# Patient Record
Sex: Male | Born: 1945 | Race: White | Hispanic: No | Marital: Married | Smoking: Current every day smoker
Health system: Southern US, Community
[De-identification: ages and names within clinical notes are randomized; demographics above are authoritative.]

## PROBLEM LIST (undated history)

## (undated) DIAGNOSIS — K219 Gastro-esophageal reflux disease without esophagitis: Secondary | ICD-10-CM

## (undated) DIAGNOSIS — F329 Major depressive disorder, single episode, unspecified: Secondary | ICD-10-CM

## (undated) DIAGNOSIS — F32A Depression, unspecified: Secondary | ICD-10-CM

## (undated) DIAGNOSIS — I739 Peripheral vascular disease, unspecified: Secondary | ICD-10-CM

## (undated) DIAGNOSIS — I10 Essential (primary) hypertension: Secondary | ICD-10-CM

## (undated) DIAGNOSIS — E119 Type 2 diabetes mellitus without complications: Secondary | ICD-10-CM

## (undated) DIAGNOSIS — E7800 Pure hypercholesterolemia, unspecified: Secondary | ICD-10-CM

## (undated) HISTORY — PX: OTHER SURGICAL HISTORY: SHX169

## (undated) HISTORY — DX: Type 2 diabetes mellitus without complications: E11.9

## (undated) HISTORY — PX: BACK SURGERY: SHX140

## (undated) HISTORY — DX: Peripheral vascular disease, unspecified: I73.9

## (undated) HISTORY — DX: Gastro-esophageal reflux disease without esophagitis: K21.9

## (undated) HISTORY — DX: Pure hypercholesterolemia, unspecified: E78.00

## (undated) HISTORY — PX: CHOLECYSTECTOMY, LAPAROSCOPIC: SHX56

## (undated) HISTORY — DX: Depression, unspecified: F32.A

## (undated) HISTORY — DX: Essential (primary) hypertension: I10

---
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
## 2019-11-01 ENCOUNTER — Encounter: Payer: Self-pay | Admitting: Internal Medicine

## 2019-11-19 ENCOUNTER — Other Ambulatory Visit: Payer: Self-pay | Admitting: *Deleted

## 2019-11-19 ENCOUNTER — Other Ambulatory Visit: Payer: Self-pay

## 2019-11-19 ENCOUNTER — Ambulatory Visit (INDEPENDENT_AMBULATORY_CARE_PROVIDER_SITE_OTHER): Payer: Medicare Other | Admitting: Nurse Practitioner

## 2019-11-19 ENCOUNTER — Encounter: Payer: Self-pay | Admitting: Nurse Practitioner

## 2019-11-19 DIAGNOSIS — D509 Iron deficiency anemia, unspecified: Secondary | ICD-10-CM

## 2019-11-19 NOTE — Assessment & Plan Note (Signed)
The patient was referred to [place] by primary care for history of iron deficiency anemia.  The patient states he was previously seen by Dr. Samuella Cota in [place], [place].  He states within the past 3 to 4 months he has had a colonoscopy, upper endoscopy, and capsule study.  He states they did not find anything.  No explanation for his anemia.  Reviewing labs his anemia has been pretty significant over the past several months including a hemoglobin of 6.3 on 04/10/2019.  This improved with transfusion to 9.7 on 05/27/2019 and most recently on 08/26/2019 it drifted a little to 9.0.  His indices are microcytic and hyperchromic.  His iron is low at 19, transferrin saturation low at 4, total iron binding capacity high at 442, ferritin low at 3.9.  Overall indication of chronic slow bleed, cannot rule out GI source.  Given has not had labs in about 3 months I will recheck a CBC, iron, ferritin.  I would like to stay ahead of his hemoglobin to prevent a transfusion dependent anemia.  I will also check an iFOBT for blood.  He states he has not seen any blood.  I will have him restart his daily iron 325 mg of iron sulfate.  I recommended Colace 100 mg daily and MiraLAX 1-2 times a day as needed in order to help for any possible constipation on iron.  I am going to go ahead and refer him to hematology/oncology for assistance in managing his anemia until we can identify a source and any possible need for treatment.  He may end up which is chronic iron deficiency anemia followed by hematology, depending on what we find.  After we review his previous colonoscopy, endoscopy, and capsule study reports (that have had the staff request) we can decide if there is any indication for repeat.  However, given that these were all completed within the past 3 to 4 months I am, hesitant to plan for repeat at this time.  He is to call us immediately for any significant change in his symptoms.  Follow-up in 4 to 6 weeks.

## 2019-11-19 NOTE — Patient Instructions (Signed)
Your health issues we discussed today were:   Iron deficiency anemia: 1. We will request your previous colonoscopy, upper endoscopy, and capsule study from [place], [place] 2. Have your labs completed when you are able to 3. Complete your stool test at home and bring it back to our office when you can 4. Restart daily iron 5. Start taking Colace 100 mg daily (stool softener) to prevent constipation with iron 6. You can use MiraLAX 1-2 times a day, as needed for any constipation even with Colace 7. We will refer you to a hematologist to help stay ahead of your anemia 8. Call us if you have any worsening symptoms or significant bleeding  Overall I recommend:  1. Continue your other current medications 2. Return for follow-up in 4 to 6 weeks 3. Call us if you have any questions or concerns   ---------------------------------------------------------------  I am glad you have gotten your COVID-19 vaccination!  Even though you are fully vaccinated you should continue to follow CDC and state/local guidelines.  ---------------------------------------------------------------   At [facility] we value your feedback. You may receive a survey about your visit today. Please share your experience as we strive to create trusting relationships with our patients to provide genuine, compassionate, quality care.  We appreciate your understanding and patience as we review any laboratory studies, imaging, and other diagnostic tests that are ordered as we care for you. Our office policy is 5 business days for review of these results, and any emergent or urgent results are addressed in a timely manner for your best interest. If you do not hear from our office in 1 week, please contact us.   We also encourage the use of MyChart, which contains your medical information for your review as well. If you are not enrolled in this feature, an access code is on this after visit summary for your  convenience. Thank you for allowing [place] to be involved in your care.  It was great to see you today!  I hope you have a great Summer!!

## 2019-11-19 NOTE — Progress Notes (Signed)
Primary Care Physician:  Aggie Cosier, MD Primary Gastroenterologist:  Dr. Marletta Lor  Chief Complaint  Patient presents with  . Anemia    no bleeding/no dark stools. Had TCS 3-4 months ago in [place] Dr. Karie Soda    HPI:   Cole Spears is a 74 y.o. male who presents on referral from primary care for anemia.  Reviewed office visit dated 08/26/2019.  Noted to be tired on and off, has been following with GI with stopped for a while.  No blood in his stools recently.  Recommended CBC and ferritin.  Requested referral to GI in [place].  No labs were included with the office visit notes.  Today he states he is doing okay overall.  Notes no recent hematochezia or dark stools.  He had a colonoscopy 3 to 4 months ago in [place], [place] with Dr. Edmonia Caprio.  He states they didn't find anything on his colonoscopy. He had a EGD after that and states they did not find anything. He states he had a caphypercholesterolemia sule endoscopy about a year ago, and wasn't told any findings. He states he doesn't think he was given his lab results from May. He has had some mid-abdominal pain which is intermittent, about every few days, lasts about a few minutes. Stools have been "off" lately, consistent with [place] 1, has straining. Denies hemorrhoids or hemorrhoid symptoms. GERD symptoms doing ok on Prilosec which is effective and denies any significant breakthrough. Denies N/V, hematochezia, melena, fever, chills, unintentional weight loss. Has not seen any bleeding of any kind including significant bruising, nosebleeds, hematuria, etc. Denies URI or flu-like symptoms. Denies loss of sense of taste or smell. The patient has received COVID-19 vaccination(s). Denies chest pain, dyspnea, dizziness, lightheadedness, syncope, near syncope. Denies any other upper or lower GI symptoms.  He is on Plavix and daily ASA 81 mg.  Past Medical History:  Diagnosis Date  . Depression   . Diabetes type 2, controlled (HCC)     . GERD (gastroesophageal reflux disease)   . Hypercholesterolemia   . Hypertension   . PVD (peripheral vascular disease) (HCC)     Past Surgical History:  Procedure Laterality Date  . BACK SURGERY     lumbosacral  . CAROTID ARTERY SURGERY Right   . CHOLECYSTECTOMY, LAPAROSCOPIC      Current Outpatient Medications  Medication Sig Dispense Refill  . aspirin EC 81 MG tablet Take 81 mg by mouth daily. Swallow whole.    Marland Kitchen atorvastatin (LIPITOR) 40 MG tablet Take 40 mg by mouth daily.    Marland Kitchen buPROPion (WELLBUTRIN XL) 150 MG 24 hr tablet Take 150 mg by mouth every morning.    . cilostazol (PLETAL) 100 MG tablet Take 100 mg by mouth 2 (two) times daily.    . clopidogrel (PLAVIX) 75 MG tablet Take 75 mg by mouth daily.    Marland Kitchen escitalopram (LEXAPRO) 20 MG tablet Take 20 mg by mouth daily.    Marland Kitchen glipiZIDE (GLUCOTROL) 5 MG tablet Take 5 mg by mouth daily.    Marland Kitchen omeprazole (PRILOSEC) 40 MG capsule Take 40 mg by mouth daily.     No current facility-administered medications for this visit.    Allergies as of 11/19/2019 - Review Complete 11/19/2019  Allergen Reaction Noted  . Metformin and related  11/19/2019    Family History  Problem Relation Age of Onset  . Colon cancer Neg Hx   . Gastric cancer Neg Hx   . Esophageal cancer Neg Hx  Social History   Socioeconomic History  . Marital status: Married    Spouse name: Not on file  . Number of children: Not on file  . Years of education: Not on file  . Highest education level: Not on file  Occupational History  . Not on file  Tobacco Use  . Smoking status: Current Every Day Smoker    Packs/day: 0.50    Years: 50.00    Pack years: 25.00    Types: Cigarettes  . Smokeless tobacco: Never Used  Substance and Sexual Activity  . Alcohol use: Yes    Comment: few beers occasionally  . Drug use: Never  . Sexual activity: Not on file  Other Topics Concern  . Not on file  Social History Narrative  . Not on file   Social Determinants  of Health   Financial Resource Strain:   . Difficulty of Paying Living Expenses:   Food Insecurity:   . Worried About Programme researcher, broadcasting/film/video in the Last Year:   . Barista in the Last Year:   Transportation Needs:   . Freight forwarder (Medical):   Marland Kitchen Lack of Transportation (Non-Medical):   Physical Activity:   . Days of Exercise per Week:   . Minutes of Exercise per Session:   Stress:   . Feeling of Stress :   Social Connections:   . Frequency of Communication with Friends and Family:   . Frequency of Social Gatherings with Friends and Family:   . Attends Religious Services:   . Active Member of Clubs or Organizations:   . Attends Banker Meetings:   Marland Kitchen Marital Status:   Intimate Partner Violence:   . Fear of Current or Ex-Partner:   . Emotionally Abused:   Marland Kitchen Physically Abused:   . Sexually Abused:     Subjective: Review of Systems  Constitutional: Negative for chills, fever, malaise/fatigue and weight loss.  HENT: Negative for congestion and sore throat.   Respiratory: Negative for cough and shortness of breath.   Cardiovascular: Negative for chest pain and palpitations.  Gastrointestinal: Negative for abdominal pain, blood in stool, diarrhea, melena, nausea and vomiting.  Musculoskeletal: Negative for joint pain and myalgias.  Skin: Negative for rash.  Neurological: Negative for dizziness and weakness.  Endo/Heme/Allergies: Does not bruise/bleed easily.  Psychiatric/Behavioral: Negative for depression. The patient is not nervous/anxious.   All other systems reviewed and are negative.      Objective: BP (!) 152/72   Pulse (!) 114   Temp (!) 97.1 F (36.2 C)   Ht 5\' 11"  (1.803 m)   Wt 166 lb 12.8 oz (75.7 kg)   BMI 23.26 kg/m  Physical Exam Vitals and nursing note reviewed.  Constitutional:      General: He is not in acute distress.    Appearance: Normal appearance. He is not ill-appearing, toxic-appearing or diaphoretic.  HENT:      Head: Normocephalic and atraumatic.     Nose: No congestion or rhinorrhea.  Eyes:     General: No scleral icterus. Cardiovascular:     Rate and Rhythm: Normal rate and regular rhythm.     Heart sounds: Normal heart sounds.  Pulmonary:     Effort: Pulmonary effort is normal.     Breath sounds: Normal breath sounds.  Abdominal:     General: Bowel sounds are normal. There is no distension.     Palpations: Abdomen is soft. There is no hepatomegaly, splenomegaly or mass.  Tenderness: There is no abdominal tenderness. There is no guarding or rebound.     Hernia: No hernia is present.  Musculoskeletal:     Cervical back: Neck supple.  Skin:    General: Skin is warm and dry.     Coloration: Skin is not jaundiced.     Findings: No bruising or rash.  Neurological:     General: No focal deficit present.     Mental Status: He is alert and oriented to person, place, and time. Mental status is at baseline.  Psychiatric:        Mood and Affect: Mood normal.        Behavior: Behavior normal.        Thought Content: Thought content normal.       11/19/2019 3:53 PM   Disclaimer: This note was dictated with voice recognition software. Similar sounding words can inadvertently be transcribed and may not be corrected upon review.

## 2019-11-21 ENCOUNTER — Ambulatory Visit (INDEPENDENT_AMBULATORY_CARE_PROVIDER_SITE_OTHER): Payer: Medicare Other

## 2019-11-21 DIAGNOSIS — D509 Iron deficiency anemia, unspecified: Secondary | ICD-10-CM

## 2019-11-21 NOTE — Progress Notes (Signed)
CC'ED TO PCP 

## 2019-11-22 ENCOUNTER — Telehealth: Payer: Self-pay

## 2019-11-22 ENCOUNTER — Other Ambulatory Visit: Payer: Self-pay

## 2019-11-22 DIAGNOSIS — S90819A Abrasion, unspecified foot, initial encounter: Secondary | ICD-10-CM | POA: Insufficient documentation

## 2019-11-22 DIAGNOSIS — D509 Iron deficiency anemia, unspecified: Secondary | ICD-10-CM

## 2019-11-22 LAB — IFOBT (OCCULT BLOOD): IFOBT: NEGATIVE

## 2019-11-22 NOTE — Telephone Encounter (Signed)
IFOBT received yesterday 11/21/19 is negative.

## 2019-11-25 NOTE — Telephone Encounter (Signed)
Great! Please tell the patient his iFOBT was negative. We are still waiting for labs results to check the status of his hgb and iron levels.

## 2019-11-26 NOTE — Telephone Encounter (Signed)
Left a detailed message with results. Awaiting lab results.

## 2019-11-28 LAB — CBC WITH DIFFERENTIAL/PLATELET
Absolute Monocytes: 581 cells/uL (ref 200–950)
Basophils Absolute: 91 cells/uL (ref 0–200)
Basophils Relative: 1.1 %
Eosinophils Absolute: 91 cells/uL (ref 15–500)
Eosinophils Relative: 1.1 %
HCT: 33.4 % — ABNORMAL LOW (ref 38.5–50.0)
Hemoglobin: 8.9 g/dL — ABNORMAL LOW (ref 13.2–17.1)
Lymphs Abs: 3635 cells/uL (ref 850–3900)
MCH: 19.1 pg — ABNORMAL LOW (ref 27.0–33.0)
MCHC: 26.6 g/dL — ABNORMAL LOW (ref 32.0–36.0)
MCV: 71.5 fL — ABNORMAL LOW (ref 80.0–100.0)
MPV: 9.9 fL (ref 7.5–12.5)
Monocytes Relative: 7 %
Neutro Abs: 3901 cells/uL (ref 1500–7800)
Neutrophils Relative %: 47 %
Platelets: 421 10*3/uL — ABNORMAL HIGH (ref 140–400)
RBC: 4.67 10*6/uL (ref 4.20–5.80)
RDW: 18.7 % — ABNORMAL HIGH (ref 11.0–15.0)
Total Lymphocyte: 43.8 %
WBC: 8.3 10*3/uL (ref 3.8–10.8)

## 2019-11-28 LAB — FERRITIN: Ferritin: 30 ng/mL (ref 24–380)

## 2019-11-28 LAB — IRON: Iron: 23 ug/dL — ABNORMAL LOW (ref 50–180)

## 2019-12-05 ENCOUNTER — Inpatient Hospital Stay (HOSPITAL_COMMUNITY): Payer: Medicare Other

## 2019-12-05 ENCOUNTER — Inpatient Hospital Stay (HOSPITAL_COMMUNITY): Payer: Medicare Other | Attending: Hematology | Admitting: Hematology

## 2019-12-05 ENCOUNTER — Other Ambulatory Visit: Payer: Self-pay

## 2019-12-05 ENCOUNTER — Encounter (HOSPITAL_COMMUNITY): Payer: Self-pay | Admitting: Hematology

## 2019-12-05 VITALS — BP 134/69 | HR 87 | Temp 97.3°F | Resp 18 | Wt 167.1 lb

## 2019-12-05 DIAGNOSIS — I10 Essential (primary) hypertension: Secondary | ICD-10-CM | POA: Diagnosis not present

## 2019-12-05 DIAGNOSIS — Z122 Encounter for screening for malignant neoplasm of respiratory organs: Secondary | ICD-10-CM

## 2019-12-05 DIAGNOSIS — F17210 Nicotine dependence, cigarettes, uncomplicated: Secondary | ICD-10-CM | POA: Insufficient documentation

## 2019-12-05 DIAGNOSIS — K5900 Constipation, unspecified: Secondary | ICD-10-CM | POA: Insufficient documentation

## 2019-12-05 DIAGNOSIS — Z79899 Other long term (current) drug therapy: Secondary | ICD-10-CM | POA: Diagnosis not present

## 2019-12-05 DIAGNOSIS — Z7984 Long term (current) use of oral hypoglycemic drugs: Secondary | ICD-10-CM | POA: Diagnosis not present

## 2019-12-05 DIAGNOSIS — E119 Type 2 diabetes mellitus without complications: Secondary | ICD-10-CM | POA: Diagnosis not present

## 2019-12-05 DIAGNOSIS — D509 Iron deficiency anemia, unspecified: Secondary | ICD-10-CM | POA: Insufficient documentation

## 2019-12-05 LAB — CBC WITH DIFFERENTIAL/PLATELET
Abs Immature Granulocytes: 0.03 10*3/uL (ref 0.00–0.07)
Basophils Absolute: 0.1 10*3/uL (ref 0.0–0.1)
Basophils Relative: 1 %
Eosinophils Absolute: 0 10*3/uL (ref 0.0–0.5)
Eosinophils Relative: 1 %
HCT: 35.2 % — ABNORMAL LOW (ref 39.0–52.0)
Hemoglobin: 9.5 g/dL — ABNORMAL LOW (ref 13.0–17.0)
Immature Granulocytes: 1 %
Lymphocytes Relative: 28 %
Lymphs Abs: 1.9 10*3/uL (ref 0.7–4.0)
MCH: 20 pg — ABNORMAL LOW (ref 26.0–34.0)
MCHC: 27 g/dL — ABNORMAL LOW (ref 30.0–36.0)
MCV: 74.1 fL — ABNORMAL LOW (ref 80.0–100.0)
Monocytes Absolute: 0.5 10*3/uL (ref 0.1–1.0)
Monocytes Relative: 7 %
Neutro Abs: 4.2 10*3/uL (ref 1.7–7.7)
Neutrophils Relative %: 62 %
Platelets: 379 10*3/uL (ref 150–400)
RBC: 4.75 MIL/uL (ref 4.22–5.81)
RDW: 26.3 % — ABNORMAL HIGH (ref 11.5–15.5)
WBC: 6.6 10*3/uL (ref 4.0–10.5)
nRBC: 0 % (ref 0.0–0.2)

## 2019-12-05 LAB — COMPREHENSIVE METABOLIC PANEL
ALT: 14 U/L (ref 0–44)
AST: 14 U/L — ABNORMAL LOW (ref 15–41)
Albumin: 4 g/dL (ref 3.5–5.0)
Alkaline Phosphatase: 79 U/L (ref 38–126)
Anion gap: 8 (ref 5–15)
BUN: 12 mg/dL (ref 8–23)
CO2: 24 mmol/L (ref 22–32)
Calcium: 8.7 mg/dL — ABNORMAL LOW (ref 8.9–10.3)
Chloride: 105 mmol/L (ref 98–111)
Creatinine, Ser: 0.96 mg/dL (ref 0.61–1.24)
GFR calc Af Amer: >60 mL/min (ref >60)
GFR calc non Af Amer: >60 mL/min (ref >60)
Glucose, Bld: 157 mg/dL — ABNORMAL HIGH (ref 70–99)
Potassium: 4.2 mmol/L (ref 3.5–5.1)
Sodium: 137 mmol/L (ref 135–145)
Total Bilirubin: 0.5 mg/dL (ref 0.3–1.2)
Total Protein: 7.2 g/dL (ref 6.5–8.1)

## 2019-12-05 LAB — LACTATE DEHYDROGENASE: LDH: 113 U/L (ref 98–192)

## 2019-12-05 LAB — RETICULOCYTES
Immature Retic Fract: 25 % — ABNORMAL HIGH (ref 2.3–15.9)
RBC.: 4.75 MIL/uL (ref 4.22–5.81)
Retic Count, Absolute: 104 10*3/uL (ref 19.0–186.0)
Retic Ct Pct: 2.2 % (ref 0.4–3.1)

## 2019-12-05 LAB — VITAMIN D 25 HYDROXY (VIT D DEFICIENCY, FRACTURES): Vit D, 25-Hydroxy: 18.87 ng/mL — ABNORMAL LOW (ref 30–100)

## 2019-12-05 LAB — IRON AND TIBC
Iron: 24 ug/dL — ABNORMAL LOW (ref 45–182)
Saturation Ratios: 6 % — ABNORMAL LOW (ref 17.9–39.5)
TIBC: 432 ug/dL (ref 250–450)
UIBC: 408 ug/dL

## 2019-12-05 LAB — VITAMIN B12: Vitamin B-12: 250 pg/mL (ref 180–914)

## 2019-12-05 LAB — FERRITIN: Ferritin: 27 ng/mL (ref 24–336)

## 2019-12-05 LAB — SAVE SMEAR(SSMR), FOR PROVIDER SLIDE REVIEW

## 2019-12-05 NOTE — Progress Notes (Signed)
CONSULT NOTE  Patient Care Team: Aggie Cosier, MD as PCP - General (Internal Medicine) Lanelle Bal, DO as Consulting Physician (Internal Medicine)  CHIEF COMPLAINTS/PURPOSE OF CONSULTATION: Iron deficiency anemia  HISTORY OF PRESENTING ILLNESS:  Cole Spears 74 y.o. male was sent to [place] by GI for better management of his iron deficiency anemia.  Patient has known anemia and has been being treated by his PCP.  He reports 2-3 months ago he needed a blood transfusion requiring 2 units of PRBC.  He reports this is the first blood transfusion he has ever required.  Patient reports he was placed on oral iron supplements about 2 weeks ago.  He does report some mild constipation however he is tolerating it well.  He reports he is going to try stool softeners with this.  Patient had an upper and lower endoscopy by Dr. Samuella Cota in [place].  There they found polyps that were benign, otherwise normal report.  Patient denies a history of blood clots.  Patient denies any bright red bleeding per rectum or melena.  Patient's occult stool cards were negative.  Patient reports he does have a few beers daily.  Patient reports he started smoking at the age of 56.  At most he smoked 2 packs/day he is now down to a half a pack per day.  He does report some ice pica.  Patient denies a family history of any anemia or cancers.  Patient lives at home alone and performs all of his own ADLs.   MEDICAL HISTORY:  Past Medical History:  Diagnosis Date  . Depression   . Diabetes type 2, controlled (HCC)   . GERD (gastroesophageal reflux disease)   . Hypercholesterolemia   . Hypertension   . PVD (peripheral vascular disease) (HCC)     SURGICAL HISTORY: Past Surgical History:  Procedure Laterality Date  . BACK SURGERY     lumbosacral  . CAROTID ARTERY SURGERY Right   . CHOLECYSTECTOMY, LAPAROSCOPIC      SOCIAL HISTORY: Social History   Socioeconomic History  . Marital status: Married     Spouse name: Not on file  . Number of children: 2  . Years of education: Not on file  . Highest education level: Not on file  Occupational History  . Occupation: retired  Tobacco Use  . Smoking status: Current Every Day Smoker    Packs/day: 0.50    Years: 50.00    Pack years: 25.00    Types: Cigarettes  . Smokeless tobacco: Never Used  Substance and Sexual Activity  . Alcohol use: Yes    Comment: few beers occasionally  . Drug use: Never  . Sexual activity: Not on file  Other Topics Concern  . Not on file  Social History Narrative  . Not on file   Social Determinants of Health   Financial Resource Strain:   . Difficulty of Paying Living Expenses: Not on file  Food Insecurity:   . Worried About Programme researcher, broadcasting/film/video in the Last Year: Not on file  . Ran Out of Food in the Last Year: Not on file  Transportation Needs:   . Lack of Transportation (Medical): Not on file  . Lack of Transportation (Non-Medical): Not on file  Physical Activity:   . Days of Exercise per Week: Not on file  . Minutes of Exercise per Session: Not on file  Stress:   . Feeling of Stress : Not on file  Social Connections:   . Frequency of Communication  with Friends and Family: Not on file  . Frequency of Social Gatherings with Friends and Family: Not on file  . Attends Religious Services: Not on file  . Active Member of Clubs or Organizations: Not on file  . Attends Banker Meetings: Not on file  . Marital Status: Not on file  Intimate Partner Violence:   . Fear of Current or Ex-Partner: Not on file  . Emotionally Abused: Not on file  . Physically Abused: Not on file  . Sexually Abused: Not on file    FAMILY HISTORY: Family History  Problem Relation Age of Onset  . Heart disease Brother   . Blindness Brother   . Colon cancer Neg Hx   . Gastric cancer Neg Hx   . Esophageal cancer Neg Hx     ALLERGIES:  is allergic to metformin and related.  MEDICATIONS:  Current Outpatient  Medications  Medication Sig Dispense Refill  . aspirin EC 81 MG tablet Take 81 mg by mouth daily. Swallow whole.    Marland Kitchen atorvastatin (LIPITOR) 40 MG tablet Take 40 mg by mouth daily.    Marland Kitchen buPROPion (WELLBUTRIN XL) 150 MG 24 hr tablet Take 150 mg by mouth every morning.    . cilostazol (PLETAL) 100 MG tablet Take 100 mg by mouth 2 (two) times daily.    . clopidogrel (PLAVIX) 75 MG tablet Take 75 mg by mouth daily.    Marland Kitchen escitalopram (LEXAPRO) 20 MG tablet Take 20 mg by mouth daily.    . ferrous sulfate 325 (65 FE) MG tablet Take 325 mg by mouth daily.    Marland Kitchen glipiZIDE (GLUCOTROL) 5 MG tablet Take 5 mg by mouth daily.    Marland Kitchen omeprazole (PRILOSEC) 40 MG capsule Take 40 mg by mouth daily.     No current facility-administered medications for this visit.    REVIEW OF SYSTEMS:   Constitutional: Denies fevers, chills or abnormal night sweats Respiratory: Denies cough or wheezes, +SOB Cardiovascular: Denies palpitation, chest discomfort or lower extremity swelling Gastrointestinal:  Denies nausea, heartburn or change in bowel habits Skin: Denies abnormal skin rashes Lymphatics: Denies new lymphadenopathy or easy bruising Neurological:Denies numbness, tingling or new weaknesses Behavioral/Psych: Mood is stable, no new changes  All other systems were reviewed with the patient and are negative.  PHYSICAL EXAMINATION: ECOG PERFORMANCE STATUS: 0 - Asymptomatic  Vitals:   12/05/19 1115  BP: 134/69  Pulse: 87  Resp: 18  Temp: (!) 97.3 F (36.3 C)  SpO2: 96%   Filed Weights   12/05/19 1115  Weight: 167 lb 1.6 oz (75.8 kg)    GENERAL:alert, no distress and comfortable SKIN: skin color, texture, turgor are normal, no rashes or significant lesions NECK: supple, thyroid normal size, non-tender, without nodularity LYMPH:  no palpable lymphadenopathy in the cervical, axillary or inguinal LUNGS: clear to auscultation and percussion with normal breathing effort HEART: regular rate & rhythm and no  murmurs and no lower extremity edema ABDOMEN:abdomen soft, non-tender and normal bowel sounds Musculoskeletal:no cyanosis of digits and no clubbing  PSYCH: alert & oriented x 3 with fluent speech NEURO: no focal motor/sensory deficits  LABORATORY DATA:  I have reviewed the data as listed Recent Results (from the past 2160 hour(s))  IFOBT POC (occult bld, rslt in office)     Status: Normal   Collection Time: 11/22/19  8:12 AM  Result Value Ref Range   IFOBT Negative   CBC with Differential/Platelet     Status: Abnormal   Collection Time: 11/27/19  2:20 PM  Result Value Ref Range   WBC 8.3 3.8 - 10.8 Thousand/uL   RBC 4.67 4.20 - 5.80 Million/uL   Hemoglobin 8.9 (L) 13.2 - 17.1 g/dL   HCT 62.3 (L) 38 - 50 %   MCV 71.5 (L) 80.0 - 100.0 fL   MCH 19.1 (L) 27.0 - 33.0 pg   MCHC 26.6 (L) 32.0 - 36.0 g/dL   RDW 76.2 (H) 83.1 - 51.7 %   Platelets 421 (H) 140 - 400 Thousand/uL   MPV 9.9 7.5 - 12.5 fL   Neutro Abs 3,901 1,500 - 7,800 cells/uL   Lymphs Abs 3,635 850 - 3,900 cells/uL   Absolute Monocytes 581 200 - 950 cells/uL   Eosinophils Absolute 91 15 - 500 cells/uL   Basophils Absolute 91 0 - 200 cells/uL   Neutrophils Relative % 47 %   Total Lymphocyte 43.8 %   Monocytes Relative 7.0 %   Eosinophils Relative 1.1 %   Basophils Relative 1.1 %  Iron     Status: Abnormal   Collection Time: 11/27/19  2:20 PM  Result Value Ref Range   Iron 23 (L) 50 - 180 mcg/dL  Ferritin     Status: None   Collection Time: 11/27/19  2:20 PM  Result Value Ref Range   Ferritin 30 24 - 380 ng/mL  Vitamin B12     Status: None   Collection Time: 12/05/19 12:30 PM  Result Value Ref Range   Vitamin B-12 250 180 - 914 pg/mL    Comment: (NOTE) This assay is not validated for testing neonatal or myeloproliferative syndrome specimens for Vitamin B12 levels. Performed at [facility], [address] [place], [place] [address]   Save Smear [facility])     Status: None   Collection Time: 12/05/19 12:30 PM   Result Value Ref Range   Smear Review SMEAR STAINED AND AVAILABLE FOR REVIEW     Comment: Performed at [facility], [address] [place], [place] [address]  Reticulocytes     Status: Abnormal   Collection Time: 12/05/19 12:30 PM  Result Value Ref Range   Retic Ct Pct 2.2 0.4 - 3.1 %   RBC. 4.75 4.22 - 5.81 MIL/uL   Retic Count, Absolute 104.0 19.0 - 186.0 K/uL   Immature Retic Fract 25.0 (H) 2.3 - 15.9 %    Comment: Performed at [facility], [address], [place], [place] [address]  Lactate dehydrogenase     Status: None   Collection Time: 12/05/19 12:30 PM  Result Value Ref Range   LDH 113 98 - 192 U/L    Comment: Performed at [facility], [address] [place], [place] [address]  Iron and TIBC     Status: Abnormal   Collection Time: 12/05/19 12:30 PM  Result Value Ref Range   Iron 24 (L) 45 - 182 ug/dL   TIBC 035 009 - 381 ug/dL   Saturation Ratios 6 (L) 17.9 - 39.5 %   UIBC 408 ug/dL    Comment: Performed at [facility], [address], [place], [place] [address]  Ferritin     Status: None   Collection Time: 12/05/19 12:30 PM  Result Value Ref Range   Ferritin 27 24 - 336 ng/mL    Comment: Performed at [facility], [address] [place], [place] [address]  Comprehensive metabolic panel     Status: Abnormal   Collection Time: 12/05/19 12:30 PM  Result Value Ref Range   Sodium 137 135 - 145  mmol/L   Potassium 4.2 3.5 - 5.1 mmol/L   Chloride 105 98 - 111 mmol/L   CO2 24 22 - 32 mmol/L   Glucose, Bld 157 (H) 70 - 99 mg/dL    Comment: Glucose reference range applies only to samples taken after fasting for at least 8 hours.   BUN 12 8 - 23 mg/dL   Creatinine, Ser 1.610.96 0.61 - 1.24 mg/dL   Calcium 8.7 (L) 8.9 - 10.3 mg/dL   Total Protein 7.2 6.5 - 8.1 g/dL   Albumin 4.0 3.5 - 5.0 g/dL   AST 14 (L) 15 - 41 U/L   ALT 14 0 - 44 U/L   Alkaline Phosphatase 79 38 - 126 U/L   Total Bilirubin 0.5 0.3 - 1.2 mg/dL   GFR calc non Af Amer >60 >60 mL/min   GFR calc Af Amer  >60 >60 mL/min   Anion gap 8 5 - 15    Comment: Performed at [facility], [address] [place], [place] [address]  CBC with Differential/Platelet     Status: Abnormal   Collection Time: 12/05/19 12:30 PM  Result Value Ref Range   WBC 6.6 4.0 - 10.5 K/uL   RBC 4.75 4.22 - 5.81 MIL/uL   Hemoglobin 9.5 (L) 13.0 - 17.0 g/dL   HCT 54.035.2 (L) 39 - 52 %   MCV 74.1 (L) 80.0 - 100.0 fL   MCH 20.0 (L) 26.0 - 34.0 pg   MCHC 27.0 (L) 30.0 - 36.0 g/dL   RDW 98.126.3 (H) 19.111.5 - 47.815.5 %   Platelets 379 150 - 400 K/uL   nRBC 0.0 0.0 - 0.2 %   Neutrophils Relative % 62 %   Neutro Abs 4.2 1.7 - 7.7 K/uL   Lymphocytes Relative 28 %   Lymphs Abs 1.9 0.7 - 4.0 K/uL   Monocytes Relative 7 %   Monocytes Absolute 0.5 0 - 1 K/uL   Eosinophils Relative 1 %   Eosinophils Absolute 0.0 0 - 0 K/uL   Basophils Relative 1 %   Basophils Absolute 0.1 0 - 0 K/uL   RBC Morphology ANISOCYTOSIS    Immature Granulocytes 1 %   Abs Immature Granulocytes 0.03 0.00 - 0.07 K/uL    Comment: Performed at [facility], [address] [place], [place] [address]    RADIOGRAPHIC STUDIES: I have personally reviewed the radiological images as listed and agreed with the findings in the report. No results found.  ASSESSMENT:  1.  Iron deficiency anemia: -CBC on 11/27/2019 shows hemoglobin 8.9 and MCV of 71.  Ferritin was 30. -Patient started taking iron tablet 2 to 3 weeks ago.  He has mild constipation. -He reportedly had a colonoscopy, endoscopy and capsule study by Dr. Samuella [place]andya in [place] in the last few months.  He states that they did not find anything. -He reportedly had 2 units of blood transfusion [facility] about 3 months ago. -Fecal occult blood test on 11/22/2019 was negative.  2.  Tobacco abuse: -Currently smoking half pack per day.  Used to smoke 2 packs/day for more than 20 years.   PLAN:  1.  Iron deficiency anemia: -We will repeat his CBC and check his iron panel today.  We will also check  for other nutritional deficiencies and rule out hemolysis.  We will also check SPEP. -If there is no significant improvement in his blood work, will consider parenteral iron therapy with Feraheme x2.  2.  Smoking history: -Because of his extensive smoking  history, he will be eligible for low-dose CT scan for lung cancer screening.  We discussed the pros and cons.  He is agreeable with the plan.  All questions were answered. The patient knows to call the clinic with any problems, questions or concerns.      Doreatha Massed, MD 12/05/19 4:25 PM

## 2019-12-06 LAB — HAPTOGLOBIN: Haptoglobin: 92 mg/dL (ref 34–355)

## 2019-12-07 LAB — METHYLMALONIC ACID, SERUM: Methylmalonic Acid, Quantitative: 1067 nmol/L — ABNORMAL HIGH (ref 0–378)

## 2019-12-07 LAB — COPPER, SERUM: Copper: 133 ug/dL — ABNORMAL HIGH (ref 69–132)

## 2019-12-09 LAB — PROTEIN ELECTROPHORESIS, SERUM
A/G Ratio: 1.2 (ref 0.7–1.7)
Albumin ELP: 3.6 g/dL (ref 2.9–4.4)
Alpha-1-Globulin: 0.3 g/dL (ref 0.0–0.4)
Alpha-2-Globulin: 0.7 g/dL (ref 0.4–1.0)
Beta Globulin: 0.9 g/dL (ref 0.7–1.3)
Gamma Globulin: 1.1 g/dL (ref 0.4–1.8)
Globulin, Total: 3 g/dL (ref 2.2–3.9)
Total Protein ELP: 6.6 g/dL (ref 6.0–8.5)

## 2019-12-26 ENCOUNTER — Ambulatory Visit (HOSPITAL_COMMUNITY): Payer: Medicare Other | Admitting: Hematology

## 2019-12-30 ENCOUNTER — Ambulatory Visit (HOSPITAL_COMMUNITY)
Admission: RE | Admit: 2019-12-30 | Discharge: 2019-12-30 | Disposition: A | Discharge status: Home / Self Care | Payer: Medicare Other | Source: Ambulatory Visit | Attending: Nurse Practitioner | Admitting: Nurse Practitioner

## 2019-12-30 ENCOUNTER — Other Ambulatory Visit: Payer: Self-pay

## 2019-12-30 DIAGNOSIS — I2510 Atherosclerotic heart disease of native coronary artery without angina pectoris: Secondary | ICD-10-CM | POA: Insufficient documentation

## 2019-12-30 DIAGNOSIS — Z122 Encounter for screening for malignant neoplasm of respiratory organs: Secondary | ICD-10-CM | POA: Insufficient documentation

## 2019-12-30 DIAGNOSIS — J439 Emphysema, unspecified: Secondary | ICD-10-CM | POA: Diagnosis not present

## 2019-12-30 DIAGNOSIS — F17210 Nicotine dependence, cigarettes, uncomplicated: Secondary | ICD-10-CM | POA: Diagnosis not present

## 2019-12-30 DIAGNOSIS — R911 Solitary pulmonary nodule: Secondary | ICD-10-CM | POA: Diagnosis not present

## 2019-12-30 DIAGNOSIS — I700 Atherosclerosis of aorta: Secondary | ICD-10-CM | POA: Insufficient documentation

## 2020-01-02 ENCOUNTER — Inpatient Hospital Stay (HOSPITAL_COMMUNITY): Payer: Medicare Other | Attending: Hematology | Admitting: Hematology

## 2020-01-02 ENCOUNTER — Other Ambulatory Visit: Payer: Self-pay

## 2020-01-02 VITALS — BP 115/70 | HR 90 | Resp 16 | Wt 164.6 lb

## 2020-01-02 DIAGNOSIS — D509 Iron deficiency anemia, unspecified: Secondary | ICD-10-CM | POA: Insufficient documentation

## 2020-01-02 DIAGNOSIS — Z122 Encounter for screening for malignant neoplasm of respiratory organs: Secondary | ICD-10-CM | POA: Diagnosis not present

## 2020-01-02 DIAGNOSIS — E538 Deficiency of other specified B group vitamins: Secondary | ICD-10-CM | POA: Insufficient documentation

## 2020-01-02 DIAGNOSIS — Z87891 Personal history of nicotine dependence: Secondary | ICD-10-CM

## 2020-01-02 MED ORDER — CYANOCOBALAMIN 1000 MCG/ML IJ SOLN
1000.0000 ug | Freq: Once | INTRAMUSCULAR | Status: AC
  Administered 2020-01-02: 1000 ug via INTRAMUSCULAR
  Filled 2020-01-02: qty 1

## 2020-01-02 MED ORDER — ERGOCALCIFEROL 1.25 MG (50000 UT) PO CAPS: 50000.0000 [IU] | ORAL_CAPSULE | ORAL | 6 refills | Status: DC

## 2020-01-02 NOTE — Patient Instructions (Signed)
[facility] at [facility] Discharge Instructions  You were seen today by Dr. Ellin Saba. He went over your recent results and scans; your results were negative for myeloma. You received your vitamin B12 injection today. Purchase vitamin B12 over the counter and take 1 mg daily. You will be prescribed vitamin D to take weekly. Dr. Ellin Saba will see you back in 3 months for labs and follow up.   Thank you for choosing [facility] at [facility] to provide your oncology and hematology care.  To afford each patient quality time with our provider, please arrive at least 15 minutes before your scheduled appointment time.   If you have a lab appointment with the Cancer Center please come in thru the Main Entrance and check in at the main information desk  You need to re-schedule your appointment should you arrive 10 or more minutes late.  We strive to give you quality time with our providers, and arriving late affects you and other patients whose appointments are after yours.  Also, if you no show three or more times for appointments you may be dismissed from the clinic at the providers discretion.     Again, thank you for choosing [facility]  Our hope is that these requests will decrease the amount of time that you wait before being seen by our physicians.       _____________________________________________________________  Should you have questions after your visit to [facility], please contact our office at [phone] between the hours of 8:00 a.m. and 4:30 p.m.  Voicemails left after 4:00 p.m. will not be returned until the following business day.  For prescription refill requests, have your pharmacy contact our office and allow 72 hours.    Cancer Center Support Programs:   > Cancer Support Group  2nd Tuesday of the month 1pm-2pm, Journey Room

## 2020-01-02 NOTE — Progress Notes (Signed)
[facility] [address] [place], [place] [address]   CLINIC:  Medical Oncology/Hematology  PCP:  Aggie Cosier, MD [facility] [address] [place] *  [phone]  REASON FOR VISIT:  Follow-up for iron deficiency anemia  PRIOR THERAPY: Blood transfusion 2 units in 08/2019  CURRENT THERAPY: Iron tablets daily  INTERVAL HISTORY:  Cole Spears, a 74 y.o. male, returns for routine follow-up for his iron deficiency anemia. Cole Spears was last seen on 12/05/2019.  Today he reports feeling well. He takes 1 tablet of iron daily and is reporting an increase in his energy levels; he takes a stool softener for his iron-induced constipation. He has melena, but denies hematochezia or numbness or tingling. His appetite is good.   REVIEW OF SYSTEMS:  Review of Systems  Constitutional: Positive for appetite change (mildly decreased) and fatigue (moderate).  Gastrointestinal: Negative for blood in stool.  Neurological: Negative for numbness.  All other systems reviewed and are negative.   PAST MEDICAL/SURGICAL HISTORY:  Past Medical History:  Diagnosis Date  . Depression   . Diabetes type 2, controlled (HCC)   . GERD (gastroesophageal reflux disease)   . Hypercholesterolemia   . Hypertension   . PVD (peripheral vascular disease) (HCC)    Past Surgical History:  Procedure Laterality Date  . BACK SURGERY     lumbosacral  . CAROTID ARTERY SURGERY Right   . CHOLECYSTECTOMY, LAPAROSCOPIC      SOCIAL HISTORY:  Social History   Socioeconomic History  . Marital status: Married    Spouse name: Not on file  . Number of children: 2  . Years of education: Not on file  . Highest education level: Not on file  Occupational History  . Occupation: retired  Tobacco Use  . Smoking status: Current Every Day Smoker    Packs/day: 0.50    Years: 50.00    Pack years: 25.00    Types: Cigarettes  . Smokeless tobacco: Never Used  Substance and  Sexual Activity  . Alcohol use: Yes    Comment: few beers occasionally  . Drug use: Never  . Sexual activity: Not on file  Other Topics Concern  . Not on file  Social History Narrative  . Not on file   Social Determinants of Health   Financial Resource Strain:   . Difficulty of Paying Living Expenses: Not on file  Food Insecurity:   . Worried About Programme researcher, broadcasting/film/video in the Last Year: Not on file  . Ran Out of Food in the Last Year: Not on file  Transportation Needs:   . Lack of Transportation (Medical): Not on file  . Lack of Transportation (Non-Medical): Not on file  Physical Activity:   . Days of Exercise per Week: Not on file  . Minutes of Exercise per Session: Not on file  Stress:   . Feeling of Stress : Not on file  Social Connections:   . Frequency of Communication with Friends and Family: Not on file  . Frequency of Social Gatherings with Friends and Family: Not on file  . Attends Religious Services: Not on file  . Active Member of Clubs or Organizations: Not on file  . Attends Banker Meetings: Not on file  . Marital Status: Not on file  Intimate Partner Violence:   . Fear of Current or Ex-Partner: Not on file  . Emotionally Abused: Not on file  . Physically Abused: Not on file  . Sexually Abused:  Not on file    FAMILY HISTORY:  Family History  Problem Relation Age of Onset  . Heart disease Brother   . Blindness Brother   . Colon cancer Neg Hx   . Gastric cancer Neg Hx   . Esophageal cancer Neg Hx     CURRENT MEDICATIONS:  Current Outpatient Medications  Medication Sig Dispense Refill  . aspirin EC 81 MG tablet Take 81 mg by mouth daily. Swallow whole.    Marland Kitchen atorvastatin (LIPITOR) 40 MG tablet Take 40 mg by mouth daily.    Marland Kitchen buPROPion (WELLBUTRIN XL) 150 MG 24 hr tablet Take 150 mg by mouth every morning.    . cilostazol (PLETAL) 100 MG tablet Take 100 mg by mouth 2 (two) times daily.    . clopidogrel (PLAVIX) 75 MG tablet Take 75 mg by  mouth daily.    . ergocalciferol (VITAMIN D2) 1.25 MG (50000 UT) capsule Take 1 capsule (50,000 Units total) by mouth once a week. 4 capsule 6  . escitalopram (LEXAPRO) 20 MG tablet Take 20 mg by mouth daily.    . ferrous sulfate 325 (65 FE) MG tablet Take 325 mg by mouth daily.    Marland Kitchen glipiZIDE (GLUCOTROL) 5 MG tablet Take 5 mg by mouth daily.    Marland Kitchen omeprazole (PRILOSEC) 40 MG capsule Take 40 mg by mouth daily.     No current facility-administered medications for this visit.    ALLERGIES:  Allergies  Allergen Reactions  . Metformin And Related     diarrhea    PHYSICAL EXAM:  Performance status (ECOG): 0 - Asymptomatic  Vitals:   01/02/20 1216  BP: 115/70  Pulse: 90  Resp: 16  SpO2: 99%   Wt Readings from Last 3 Encounters:  01/02/20 164 lb 9.6 oz (74.7 kg)  12/05/19 167 lb 1.6 oz (75.8 kg)  11/19/19 166 lb 12.8 oz (75.7 kg)   Physical Exam Vitals reviewed.  Constitutional:      Appearance: Normal appearance.  Cardiovascular:     Rate and Rhythm: Normal rate and regular rhythm.     Pulses: Normal pulses.     Heart sounds: Normal heart sounds.  Pulmonary:     Effort: Pulmonary effort is normal.     Breath sounds: Normal breath sounds.  Abdominal:     Palpations: Abdomen is soft. There is no mass.     Tenderness: There is no abdominal tenderness.  Neurological:     General: No focal deficit present.     Mental Status: He is alert and oriented to person, place, and time.  Psychiatric:        Mood and Affect: Mood normal.        Behavior: Behavior normal.     LABORATORY DATA:  I have reviewed the labs as listed.  CBC Latest Ref Rng & Units 12/05/2019 11/27/2019  WBC 4.0 - 10.5 K/uL 6.6 8.3  Hemoglobin 13.0 - 17.0 g/dL 0.9(N) 8.9(L)  Hematocrit 39 - 52 % 35.2(L) 33.4(L)  Platelets 150 - 400 K/uL 379 421(H)   CMP Latest Ref Rng & Units 12/05/2019  Glucose 70 - 99 mg/dL 235(T)  BUN 8 - 23 mg/dL 12  Creatinine 7.32 - 2.02 mg/dL 5.42  Sodium 706 - 237 mmol/L 137    Potassium 3.5 - 5.1 mmol/L 4.2  Chloride 98 - 111 mmol/L 105  CO2 22 - 32 mmol/L 24  Calcium 8.9 - 10.3 mg/dL 6.2(G)  Total Protein 6.5 - 8.1 g/dL 7.2  Total Bilirubin 0.3 -  1.2 mg/dL 0.5  Alkaline Phos 38 - 126 U/L 79  AST 15 - 41 U/L 14(L)  ALT 0 - 44 U/L 14      Component Value Date/Time   RBC 4.75 12/05/2019 1230   RBC 4.75 12/05/2019 1230   MCV 74.1 (L) 12/05/2019 1230   MCH 20.0 (L) 12/05/2019 1230   MCHC 27.0 (L) 12/05/2019 1230   RDW 26.3 (H) 12/05/2019 1230   LYMPHSABS 1.9 12/05/2019 1230   MONOABS 0.5 12/05/2019 1230   EOSABS 0.0 12/05/2019 1230   BASOSABS 0.1 12/05/2019 1230   Lab Results  Component Value Date   LDH 113 12/05/2019   Lab Results  Component Value Date   TIBC 432 12/05/2019   FERRITIN 27 12/05/2019   FERRITIN 30 11/27/2019   IRONPCTSAT 6 (L) 12/05/2019    DIAGNOSTIC IMAGING:  I have independently reviewed the scans and discussed with the patient. CT CHEST LUNG CA SCREEN LOW DOSE W/O CM  Result Date: 12/31/2019 CLINICAL DATA:  74 year old asymptomatic male current smoker with 70 pack-year smoking history. EXAM: CT CHEST WITHOUT CONTRAST LOW-DOSE FOR LUNG CANCER SCREENING TECHNIQUE: Multidetector CT imaging of the chest was performed following the standard protocol without IV contrast. COMPARISON:  None. FINDINGS: Cardiovascular: Normal heart size. No significant pericardial effusion/thickening. Three-vessel coronary atherosclerosis. Atherosclerotic nonaneurysmal thoracic aorta. Normal caliber pulmonary arteries. Mediastinum/Nodes: No discrete thyroid nodules. Unremarkable esophagus. No pathologically enlarged axillary, mediastinal or hilar lymph nodes, noting limited sensitivity for the detection of hilar adenopathy on this noncontrast study. Lungs/Pleura: No pneumothorax. No pleural effusion. Moderate centrilobular and paraseptal emphysema with diffuse bronchial wall thickening. No acute consolidative airspace disease or lung masses. A few scattered  pulmonary nodules, largest a flat lesion along the minor fissure in the basilar right upper lobe measuring 6.5 mm in volume derived mean diameter (series 4/image 147). Upper abdomen: Cholecystectomy. Musculoskeletal: No aggressive appearing focal osseous lesions. Mild thoracic spondylosis. IMPRESSION: 1. Lung-RADS 3, probably benign findings. Dominant 6.5 mm right upper lobe nodule. Short-term follow-up in 6 months is recommended with repeat low-dose chest CT without contrast (please use the following order, "CT CHEST LCS NODULE FOLLOW-UP W/O CM"). 2. Three-vessel coronary atherosclerosis. 3. Aortic Atherosclerosis (ICD10-I70.0) and Emphysema (ICD10-J43.9). Electronically Signed   By: Delbert PhenixJason A Poff M.D.   On: 12/31/2019 08:22     ASSESSMENT:  1.  Iron deficiency anemia: -CBC on 11/27/2019 shows hemoglobin 8.9 and MCV of 71.  Ferritin was 30. -Patient started taking iron tablet 2 to 3 weeks ago.  He has mild constipation. -He reportedly had a colonoscopy, endoscopy and capsule study by Dr. Samuella [place]Pandya in [place] in the last few months.  He states that they did not find anything. -He reportedly had 2 units of blood [facility] about 3 months ago. -Fecal occult blood test on 11/22/2019 was negative.  2.  Tobacco abuse: -Currently smoking half pack per day.  Used to smoke 2 packs/day for more than 20 years. -CT lung cancer screening scan on 12/30/2019 was lung RADS 3.  Dominant 6.5 mm right upper lobe nodule.  4859-month follow-up was recommended.   PLAN:  1.  Iron deficiency anemia: -He is continuing iron tablet daily and is tolerating reasonably well.  I discussed other labs including SPEP which was negative. -His hemoglobin improved to 9.5.  Ferritin was 27. -He will continue iron tablet daily with stool softener.  RTC 3 months with labs.  2.  Smoking history: -Reviewed lung cancer CT scan results which showed 6.5 mm  right upper lobe nodule. -Plan to repeat CT scan in 6  months.  3.  Vitamin B12 deficiency: -His B12 level is low with elevated methylmalonic acid.  He will receive B12 injection today.  He will start B12 1 mg tablet daily.  4.  Vitamin D deficiency: -We have sent a prescription for vitamin D 50,000 units daily.  Orders placed this encounter:  No orders of the defined types were placed in this encounter.    Doreatha Massed, MD [facility] [phone]   I, Drue Second, am acting as a scribe for Dr. Payton Mccallum.  I, Doreatha Massed MD, have reviewed the above documentation for accuracy and completeness, and I agree with the above.

## 2020-01-02 NOTE — Progress Notes (Signed)
Orders received for vitamin B-12 injection, see MAR for administration.

## 2020-01-15 ENCOUNTER — Ambulatory Visit (INDEPENDENT_AMBULATORY_CARE_PROVIDER_SITE_OTHER): Payer: Medicare Other | Admitting: Nurse Practitioner

## 2020-01-15 ENCOUNTER — Other Ambulatory Visit: Payer: Self-pay

## 2020-01-15 ENCOUNTER — Encounter: Payer: Self-pay | Admitting: Nurse Practitioner

## 2020-01-15 VITALS — BP 154/76 | HR 88 | Temp 97.3°F | Ht 71.0 in | Wt 168.0 lb

## 2020-01-15 DIAGNOSIS — K5903 Drug induced constipation: Secondary | ICD-10-CM

## 2020-01-15 DIAGNOSIS — D509 Iron deficiency anemia, unspecified: Secondary | ICD-10-CM

## 2020-01-15 DIAGNOSIS — K5900 Constipation, unspecified: Secondary | ICD-10-CM | POA: Insufficient documentation

## 2020-01-15 NOTE — Progress Notes (Signed)
Referring Provider: Aggie Cosier, MD Primary Care Physician:  Aggie Cosier, MD Primary GI:  Dr. Marletta Lor  Chief Complaint  Patient presents with   Anemia    f/u.    HPI:   Cole Spears is a 74 y.o. male who presents for follow-up.  The patient was last seen in our office 11/19/2019 for IDA.  His last visit was a referral from primary care for anemia.  As last visit noted no recent hematochezia or dark stools, colonoscopy 3 to 4 months ago in [place], [place] with Dr. Sarajane Marek and states they "did not find anything."  EGD after that also did not find anything.  He noted a capsule endoscopy about a year prior and was not told of any findings.  Notes intermittent mid abdominal pain every few days it only lasts for couple minutes.  Has been somewhat more constipated lately.  GERD doing well on Prilosec.  No other overt GI complaints.  Recommended request previous colonoscopy, EGD, capsule study results, labs including CBC, iron, ferritin.  Also recommended fecal occult blood testing.  Start Colace 100 mg daily and add MiraLAX 1-2 times a day as needed for constipation despite Colace, refer to hematology for IDA, follow-up in 4 to 6 weeks.  Fecal occult blood testing was negative.  Labs completed 11/27/2019 which found anemia with hemoglobin 8.9 (no baseline to compare to), iron low at 23, ferritin low/normal at 30.  The patient has since seen hematology twice.  Most recent hematology visit 01/02/2020 for IDA.  Was previously placed on oral iron tablet daily.  Notes moderate fatigue.  Recheck of CBC 12/05/2019 found hemoglobin improved to 9.5.  Creatinine normal at that time.  Some mild constipation with iron therapy.  Recommended continue iron tablet daily with stool softener and follow-up in 3 months.  He was noted to be B12 deficient as well and recommended B12 injection.  Today he states he is doing okay overall.  His energy has improved a little bit. Still on oral iron. Denies hematochezia.  Stools are dark on iron. States he's going to get IV iron next heme/onc visit. Oral iron is causing constipation which he manages pretty well on colace. Mild, rare abdominal discomfort but nothing prolonged or severe. Denies N/V, fever, chills, unintentional weight loss. Denies URI or flu-like symptoms. Denies loss of sense of taste or smell. The patient has received COVID-19 vaccination(s). Denies chest pain, dyspnea, dizziness, lightheadedness, syncope, near syncope. Denies any other upper or lower GI symptoms.   Past Medical History:  Diagnosis Date   Depression    Diabetes type 2, controlled (HCC)    GERD (gastroesophageal reflux disease)    Hypercholesterolemia    Hypertension    PVD (peripheral vascular disease) (HCC)     Past Surgical History:  Procedure Laterality Date   BACK SURGERY     lumbosacral   CAROTID ARTERY SURGERY Right    CHOLECYSTECTOMY, LAPAROSCOPIC      Current Outpatient Medications  Medication Sig Dispense Refill   aspirin EC 81 MG tablet Take 81 mg by mouth daily. Swallow whole.     atorvastatin (LIPITOR) 40 MG tablet Take 40 mg by mouth daily.     buPROPion (WELLBUTRIN XL) 150 MG 24 hr tablet Take 150 mg by mouth every morning.     cilostazol (PLETAL) 100 MG tablet Take 100 mg by mouth 2 (two) times daily.     clopidogrel (PLAVIX) 75 MG tablet Take 75 mg by mouth daily.  Cyanocobalamin (B-12 PO) Take 5,000 mcg by mouth daily.     docusate sodium (COLACE) 100 MG capsule Take 100 mg by mouth daily.     ergocalciferol (VITAMIN D2) 1.25 MG (50000 UT) capsule Take 1 capsule (50,000 Units total) by mouth once a week. 4 capsule 6   escitalopram (LEXAPRO) 20 MG tablet Take 20 mg by mouth daily.     ferrous sulfate 325 (65 FE) MG tablet Take 325 mg by mouth daily.     glipiZIDE (GLUCOTROL) 5 MG tablet Take 5 mg by mouth daily.     omeprazole (PRILOSEC) 40 MG capsule Take 40 mg by mouth daily.     No current facility-administered medications  for this visit.    Allergies as of 01/15/2020 - Review Complete 01/15/2020  Allergen Reaction Noted   Metformin and related  11/19/2019    Family History  Problem Relation Age of Onset   Heart disease Brother    Blindness Brother    Colon cancer Neg Hx    Gastric cancer Neg Hx    Esophageal cancer Neg Hx     Social History   Socioeconomic History   Marital status: Married    Spouse name: Not on file   Number of children: 2   Years of education: Not on file   Highest education level: Not on file  Occupational History   Occupation: retired  Tobacco Use   Smoking status: Current Every Day Smoker    Packs/day: 0.50    Years: 50.00    Pack years: 25.00    Types: Cigarettes   Smokeless tobacco: Never Used  Substance and Sexual Activity   Alcohol use: Yes    Comment: few beers occasionally   Drug use: Never   Sexual activity: Not on file  Other Topics Concern   Not on file  Social History Narrative   Not on file   Social Determinants of Health   Financial Resource Strain:    Difficulty of Paying Living Expenses: Not on file  Food Insecurity:    Worried About Programme researcher, broadcasting/film/video in the Last Year: Not on file   [employer] of Food in the Last Year: Not on file  Transportation Needs:    Lack of Transportation (Medical): Not on file   Lack of Transportation (Non-Medical): Not on file  Physical Activity:    Days of Exercise per Week: Not on file   Minutes of Exercise per Session: Not on file  Stress:    Feeling of Stress : Not on file  Social Connections:    Frequency of Communication with Friends and Family: Not on file   Frequency of Social Gatherings with Friends and Family: Not on file   Attends Religious Services: Not on file   Active Member of Clubs or Organizations: Not on file   Attends Banker Meetings: Not on file   Marital Status: Not on file    Subjective: Review of Systems  Constitutional: Negative for  chills, fever, malaise/fatigue and weight loss.  HENT: Negative for congestion and sore throat.   Respiratory: Negative for cough and shortness of breath.   Cardiovascular: Negative for chest pain and palpitations.  Gastrointestinal: Negative for abdominal pain, blood in stool, diarrhea, melena, nausea and vomiting.  Musculoskeletal: Negative for joint pain and myalgias.  Skin: Negative for rash.  Neurological: Negative for dizziness and weakness.  Endo/Heme/Allergies: Does not bruise/bleed easily.  Psychiatric/Behavioral: Negative for depression. The patient is not nervous/anxious.   All other  systems reviewed and are negative.    Objective: BP (!) 154/76    Pulse 88    Temp (!) 97.3 F (36.3 C) (Oral)    Ht 5\' 11"  (1.803 m)    Wt 168 lb (76.2 kg)    BMI 23.43 kg/m  Physical Exam Vitals and nursing note reviewed.  Constitutional:      General: He is not in acute distress.    Appearance: Normal appearance. He is not ill-appearing, toxic-appearing or diaphoretic.  HENT:     Head: Normocephalic and atraumatic.     Nose: No congestion or rhinorrhea.  Eyes:     General: No scleral icterus. Cardiovascular:     Rate and Rhythm: Normal rate and regular rhythm.     Heart sounds: Normal heart sounds.  Pulmonary:     Effort: Pulmonary effort is normal.     Breath sounds: Normal breath sounds.  Abdominal:     General: Bowel sounds are normal. There is no distension.     Palpations: Abdomen is soft. There is no hepatomegaly, splenomegaly or mass.     Tenderness: There is no abdominal tenderness. There is no guarding or rebound.     Hernia: No hernia is present.  Musculoskeletal:     Cervical back: Neck supple.  Skin:    General: Skin is warm and dry.     Coloration: Skin is not jaundiced.     Findings: No bruising or rash.  Neurological:     General: No focal deficit present.     Mental Status: He is alert and oriented to person, place, and time. Mental status is at baseline.    Psychiatric:        Mood and Affect: Mood normal.        Behavior: Behavior normal.        Thought Content: Thought content normal.      Assessment:  Very pleasant 74 year old male with a history of iron deficiency anemia as well as B12 deficiency.  He was referred to hematology due to anemia without significant findings on colonoscopy, endoscopy, and [facility] capsule endoscopy.  Anemia: Symptoms improved with some improvement in his energy level, hemoglobin noted to be improved as per HPI.  Continues on oral iron.  May transition to IV iron due to over constipation on oral iron.  Recommend he continue his current medications, follow-up with hematology based on their recommendations.  Call [phone] for any obvious GI bleeding  Constipation: Likely exacerbated by oral iron supplement.  Colace is managing well at this time.  He is to call us if he has any worsening constipation despite Colace and we can consider other options.  If he does transition to oral iron this will likely resolve itself.   Plan: 1. Continue current medications 2. Follow-up with hematology based on recommendations 3. Call us for any obvious GI bleed 4. Follow-up in 6 months    Thank you for allowing [place] to participate in the care of [place], DNP, AGNP-C Adult & Gerontological Nurse Practitioner [facility]   01/15/2020 4:08 PM   Disclaimer: This note was dictated with voice recognition software. Similar sounding words can inadvertently be transcribed and may not be corrected upon review.

## 2020-01-15 NOTE — Patient Instructions (Signed)
Your health issues we discussed today were:   Iron deficiency anemia: 1. Continue taking your oral iron 2. Follow-up with Dr. Kirtland Bouchard and hematology based on their recommendations 3. Call us if you have any obvious blood in your stool  Constipation due to iron: 1. I am glad Colace is working well for you 2. Continue to take Colace as needed for constipation 3. Because of your constipation becomes worse and Colace is no longer effective  Overall I recommend:  1. Continue other current medications 2. Return for follow-up in 6 months 3. Call us if you have any questions or concerns   ---------------------------------------------------------------  I am glad you have gotten your COVID-19 vaccination!  Even though you are fully vaccinated you should continue to follow CDC and state/local guidelines.  ---------------------------------------------------------------   At [facility] we value your feedback. You may receive a survey about your visit today. Please share your experience as we strive to create trusting relationships with our patients to provide genuine, compassionate, quality care.  We appreciate your understanding and patience as we review any laboratory studies, imaging, and other diagnostic tests that are ordered as we care for you. Our office policy is 5 business days for review of these results, and any emergent or urgent results are addressed in a timely manner for your best interest. If you do not hear from our office in 1 week, please contact us.   We also encourage the use of MyChart, which contains your medical information for your review as well. If you are not enrolled in this feature, an access code is on this after visit summary for your convenience. Thank you for allowing [place] to be involved in your care.  It was great to see you today!  I hope you have a great Fall!!

## 2020-03-26 ENCOUNTER — Other Ambulatory Visit (HOSPITAL_COMMUNITY): Payer: Medicare Other

## 2020-04-02 ENCOUNTER — Ambulatory Visit (HOSPITAL_COMMUNITY): Payer: Medicare Other | Admitting: Hematology

## 2020-04-08 ENCOUNTER — Other Ambulatory Visit: Payer: Self-pay

## 2020-04-08 ENCOUNTER — Inpatient Hospital Stay (HOSPITAL_COMMUNITY): Payer: Medicare Other | Attending: Medical

## 2020-04-08 DIAGNOSIS — F17210 Nicotine dependence, cigarettes, uncomplicated: Secondary | ICD-10-CM | POA: Insufficient documentation

## 2020-04-08 DIAGNOSIS — E119 Type 2 diabetes mellitus without complications: Secondary | ICD-10-CM | POA: Insufficient documentation

## 2020-04-08 DIAGNOSIS — D509 Iron deficiency anemia, unspecified: Secondary | ICD-10-CM | POA: Insufficient documentation

## 2020-04-08 DIAGNOSIS — Z79899 Other long term (current) drug therapy: Secondary | ICD-10-CM | POA: Diagnosis not present

## 2020-04-08 DIAGNOSIS — Z7984 Long term (current) use of oral hypoglycemic drugs: Secondary | ICD-10-CM | POA: Diagnosis not present

## 2020-04-08 DIAGNOSIS — I10 Essential (primary) hypertension: Secondary | ICD-10-CM | POA: Insufficient documentation

## 2020-04-08 LAB — CBC WITH DIFFERENTIAL/PLATELET
Abs Immature Granulocytes: 0.03 10*3/uL (ref 0.00–0.07)
Basophils Absolute: 0.1 10*3/uL (ref 0.0–0.1)
Basophils Relative: 1 %
Eosinophils Absolute: 0.1 10*3/uL (ref 0.0–0.5)
Eosinophils Relative: 1 %
HCT: 49.3 % (ref 39.0–52.0)
Hemoglobin: 16.4 g/dL (ref 13.0–17.0)
Immature Granulocytes: 0 %
Lymphocytes Relative: 42 %
Lymphs Abs: 3.5 10*3/uL (ref 0.7–4.0)
MCH: 32 pg (ref 26.0–34.0)
MCHC: 33.3 g/dL (ref 30.0–36.0)
MCV: 96.1 fL (ref 80.0–100.0)
Monocytes Absolute: 0.5 10*3/uL (ref 0.1–1.0)
Monocytes Relative: 6 %
Neutro Abs: 4.2 10*3/uL (ref 1.7–7.7)
Neutrophils Relative %: 50 %
Platelets: 272 10*3/uL (ref 150–400)
RBC: 5.13 MIL/uL (ref 4.22–5.81)
RDW: 15.3 % (ref 11.5–15.5)
WBC: 8.3 10*3/uL (ref 4.0–10.5)
nRBC: 0 % (ref 0.0–0.2)

## 2020-04-08 LAB — FERRITIN: Ferritin: 27 ng/mL (ref 24–336)

## 2020-04-08 LAB — IRON AND TIBC
Iron: 87 ug/dL (ref 45–182)
Saturation Ratios: 26 % (ref 17.9–39.5)
TIBC: 333 ug/dL (ref 250–450)
UIBC: 246 ug/dL

## 2020-04-08 LAB — VITAMIN D 25 HYDROXY (VIT D DEFICIENCY, FRACTURES): Vit D, 25-Hydroxy: 82.32 ng/mL (ref 30–100)

## 2020-04-08 LAB — VITAMIN B12: Vitamin B-12: 1214 pg/mL — ABNORMAL HIGH (ref 180–914)

## 2020-04-15 ENCOUNTER — Other Ambulatory Visit: Payer: Self-pay

## 2020-04-15 ENCOUNTER — Inpatient Hospital Stay (HOSPITAL_BASED_OUTPATIENT_CLINIC_OR_DEPARTMENT_OTHER): Payer: Medicare Other | Admitting: Hematology

## 2020-04-15 VITALS — BP 146/75 | HR 101 | Temp 98.7°F | Resp 18 | Wt 169.9 lb

## 2020-04-15 DIAGNOSIS — D509 Iron deficiency anemia, unspecified: Secondary | ICD-10-CM | POA: Diagnosis not present

## 2020-04-15 NOTE — Progress Notes (Signed)
[facility] [address], [place] [address]   CLINIC:  Medical Oncology/Hematology  PCP:  Aggie Cosier, MD [facility] [address] [place] *  [phone]  REASON FOR VISIT:  Follow-up for IDA  PRIOR THERAPY: Blood transfusion 2 units on 08/2019  CURRENT THERAPY: Iron tablets daily  INTERVAL HISTORY:  [name], a 74 y.o. male, returns for routine follow-up for his IDA. Cole Spears was last seen on 01/02/2020.  Today he reports feeling well. He continues taking vitamin B12 daily and vitamin D weekly. He is tolerating the iron tablets well and reports melena, but denies hematochezia. He reports having constipation from the iron tablets and takes Colace and Dulcolax inconsitently; he reports having a BM every 6-7 days. His energy levels fluctuate daily. He reports that he feels much better compared to 3 months ago.   REVIEW OF SYSTEMS:  Review of Systems  Constitutional: Positive for appetite change (75%) and fatigue (75%).  Respiratory: Positive for cough and shortness of breath.   Cardiovascular: Positive for chest pain.  Gastrointestinal: Positive for constipation. Negative for blood in stool.  All other systems reviewed and are negative.   PAST MEDICAL/SURGICAL HISTORY:  Past Medical History:  Diagnosis Date  . Depression   . Diabetes type 2, controlled (HCC)   . GERD (gastroesophageal reflux disease)   . Hypercholesterolemia   . Hypertension   . PVD (peripheral vascular disease) (HCC)    Past Surgical History:  Procedure Laterality Date  . BACK SURGERY     lumbosacral  . CAROTID ARTERY SURGERY Right   . CHOLECYSTECTOMY, LAPAROSCOPIC      SOCIAL HISTORY:  Social History   Socioeconomic History  . Marital status: Married    Spouse name: Not on file  . Number of children: 2  . Years of education: Not on file  . Highest education level: Not on file  Occupational History  . Occupation: retired   Tobacco Use  . Smoking status: Current Every Day Smoker    Packs/day: 0.50    Years: 50.00    Pack years: 25.00    Types: Cigarettes  . Smokeless tobacco: Never Used  Substance and Sexual Activity  . Alcohol use: Yes    Comment: few beers occasionally  . Drug use: Never  . Sexual activity: Not on file  Other Topics Concern  . Not on file  Social History Narrative  . Not on file   Social Determinants of Health   Financial Resource Strain: Not on file  Food Insecurity: Not on file  Transportation Needs: Not on file  Physical Activity: Not on file  Stress: Not on file  Social Connections: Not on file  Intimate Partner Violence: Not on file    FAMILY HISTORY:  Family History  Problem Relation Age of Onset  . Heart disease Brother   . Blindness Brother   . Colon cancer Neg Hx   . Gastric cancer Neg Hx   . Esophageal cancer Neg Hx     CURRENT MEDICATIONS:  Current Outpatient Medications  Medication Sig Dispense Refill  . aspirin EC 81 MG tablet Take 81 mg by mouth daily. Swallow whole.    Marland Kitchen atorvastatin (LIPITOR) 40 MG tablet Take 40 mg by mouth daily.    Marland Kitchen buPROPion (WELLBUTRIN XL) 150 MG 24 hr tablet Take 150 mg by mouth every morning.    . cilostazol (PLETAL) 100 MG tablet Take 100 mg by mouth 2 (two) times daily.    Marland Kitchen  clopidogrel (PLAVIX) 75 MG tablet Take 75 mg by mouth daily.    . Cyanocobalamin (B-12 PO) Take 5,000 mcg by mouth daily.    Marland Kitchen docusate sodium (COLACE) 100 MG capsule Take 100 mg by mouth daily.    . ergocalciferol (VITAMIN D2) 1.25 MG (50000 UT) capsule Take 1 capsule (50,000 Units total) by mouth once a week. 4 capsule 6  . escitalopram (LEXAPRO) 20 MG tablet Take 20 mg by mouth daily.    . ferrous sulfate 325 (65 FE) MG tablet Take 325 mg by mouth daily.    Marland Kitchen glipiZIDE (GLUCOTROL) 5 MG tablet Take 5 mg by mouth daily.    Marland Kitchen omeprazole (PRILOSEC) 40 MG capsule Take 40 mg by mouth daily.     No current facility-administered medications for this  visit.    ALLERGIES:  Allergies  Allergen Reactions  . Metformin And Related     diarrhea    PHYSICAL EXAM:  Performance status (ECOG): 0 - Asymptomatic  Vitals:   04/15/20 1501  BP: (!) 146/75  Pulse: (!) 101  Resp: 18  Temp: 98.7 F (37.1 C)  SpO2: 97%   Wt Readings from Last 3 Encounters:  04/15/20 169 lb 14.4 oz (77.1 kg)  01/15/20 168 lb (76.2 kg)  01/02/20 164 lb 9.6 oz (74.7 kg)   Physical Exam Vitals reviewed.  Constitutional:      Appearance: Normal appearance.  Cardiovascular:     Rate and Rhythm: Normal rate and regular rhythm.     Pulses: Normal pulses.     Heart sounds: Normal heart sounds.  Pulmonary:     Effort: Pulmonary effort is normal.     Breath sounds: Normal breath sounds.  Abdominal:     Palpations: Abdomen is soft.     Tenderness: There is no abdominal tenderness.  Musculoskeletal:     Right lower leg: No edema.     Left lower leg: No edema.  Neurological:     General: No focal deficit present.     Mental Status: He is alert and oriented to person, place, and time.  Psychiatric:        Mood and Affect: Mood normal.        Behavior: Behavior normal.     LABORATORY DATA:  I have reviewed the labs as listed.  CBC Latest Ref Rng & Units 04/08/2020 12/05/2019 11/27/2019  WBC 4.0 - 10.5 K/uL 8.3 6.6 8.3  Hemoglobin 13.0 - 17.0 g/dL 74.1 2.8(N) 8.9(L)  Hematocrit 39.0 - 52.0 % 49.3 35.2(L) 33.4(L)  Platelets 150 - 400 K/uL 272 379 421(H)   CMP Latest Ref Rng & Units 12/05/2019  Glucose 70 - 99 mg/dL 867(E)  BUN 8 - 23 mg/dL 12  Creatinine 7.20 - 9.47 mg/dL 0.96  Sodium 283 - 662 mmol/L 137  Potassium 3.5 - 5.1 mmol/L 4.2  Chloride 98 - 111 mmol/L 105  CO2 22 - 32 mmol/L 24  Calcium 8.9 - 10.3 mg/dL 9.4(T)  Total Protein 6.5 - 8.1 g/dL 7.2  Total Bilirubin 0.3 - 1.2 mg/dL 0.5  Alkaline Phos 38 - 126 U/L 79  AST 15 - 41 U/L 14(L)  ALT 0 - 44 U/L 14      Component Value Date/Time   RBC 5.13 04/08/2020 1329   MCV 96.1 04/08/2020  1329   MCH 32.0 04/08/2020 1329   MCHC 33.3 04/08/2020 1329   RDW 15.3 04/08/2020 1329   LYMPHSABS 3.5 04/08/2020 1329   MONOABS 0.5 04/08/2020 1329   EOSABS 0.1  04/08/2020 1329   BASOSABS 0.1 04/08/2020 1329   Lab Results  Component Value Date   VD25OH 82.32 04/08/2020   VD25OH 18.87 (L) 12/05/2019   Lab Results  Component Value Date   TIBC 333 04/08/2020   TIBC 432 12/05/2019   FERRITIN 27 04/08/2020   FERRITIN 27 12/05/2019   FERRITIN 30 11/27/2019   IRONPCTSAT 26 04/08/2020   IRONPCTSAT 6 (L) 12/05/2019    DIAGNOSTIC IMAGING:  I have independently reviewed the scans and discussed with the patient. No results found.   ASSESSMENT:  1. Iron deficiency anemia: -CBC on 11/27/2019 shows hemoglobin 8.9 and MCV of 71. Ferritin was 30. -Patient started taking iron tablet 2 to 3 weeks ago. He has mild constipation. -He reportedly had a colonoscopy, endoscopy and capsule study by Dr. Harl Favor [place] in the last few months. He states that they did not find anything. -He reportedly had 2 units of blood transfusion in [facility] about 3 months ago. -Fecal occult blood test on 11/22/2019 was negative.  2. Tobacco abuse: -Currently smoking half pack per day. Used to smoke 2 packs/day for more than 20 years. -CT lung cancer screening scan on 12/30/2019 was lung RADS 3.  Dominant 6.5 mm right upper lobe nodule.  9-month follow-up was recommended.   PLAN:  1. Iron deficiency anemia: -He is taking iron tablet daily.  He has constipation. -Reviewed labs from 04/08/2020.  Hemoglobin improved to 16.4.  Ferritin is 27 with percent saturation of 26. -I have told him to cut back on iron to 1 tablet every other day. -RTC 4 months with repeat labs.  2. Smoking history: -CT scan showed 6.5 mm right upper lobe lung nodule.  Repeat CT in 6 months was recommended. -We will arrange CT scan prior to next visit.  3.  Vitamin B12 deficiency: -Continue B12 1 mg tablet  daily.  4.  Vitamin D deficiency: -Vitamin D is 26.  Discontinue weekly vitamin D. -Start taking vitamin D 2000 units daily.   Orders placed this encounter:  No orders of the defined types were placed in this encounter.    Doreatha Massed, MD [facility] [phone]   I, Drue Second, am acting as a scribe for Dr. Payton Mccallum.  I, Doreatha Massed MD, have reviewed the above documentation for accuracy and completeness, and I agree with the above.

## 2020-04-15 NOTE — Patient Instructions (Signed)
[facility] at [facility] Discharge Instructions  You were seen today by Dr. Ellin Saba. He went over your recent results. Start taking the iron tablets every other day and take Colace daily to prevent constipation. Purchase vitamin D 2,000 units over the counter and take daily. Dr. Ellin Saba will see you back in 4 months for labs and follow up.   Thank you for choosing [facility] to provide your oncology and hematology care.  To afford each patient quality time with our provider, please arrive at least 15 minutes before your scheduled appointment time.   If you have a lab appointment with the Cancer Center please come in thru the Main Entrance and check in at the main information desk  You need to re-schedule your appointment should you arrive 10 or more minutes late.  We strive to give you quality time with our providers, and arriving late affects you and other patients whose appointments are after yours.  Also, if you no show three or more times for appointments you may be dismissed from the clinic at the providers discretion.     Again, thank you for choosing [facility].  Our hope is that these requests will decrease the amount of time that you wait before being seen by our physicians.       _____________________________________________________________  Should you have questions after your visit to [facility], please contact our office at [phone] between the hours of 8:00 a.m. and 4:30 p.m.  Voicemails left after 4:00 p.m. will not be returned until the following business day.  For prescription refill requests, have your pharmacy contact our office and allow 72 hours.    Cancer Center Support Programs:   > Cancer Support Group  2nd Tuesday of the month 1pm-2pm, Journey Room

## 2020-06-29 ENCOUNTER — Other Ambulatory Visit: Payer: Self-pay

## 2020-06-29 ENCOUNTER — Ambulatory Visit (HOSPITAL_COMMUNITY)
Admission: RE | Admit: 2020-06-29 | Discharge: 2020-06-29 | Disposition: A | Discharge status: Home / Self Care | Payer: Medicare Other | Source: Ambulatory Visit | Attending: Hematology | Admitting: Hematology

## 2020-06-29 ENCOUNTER — Inpatient Hospital Stay (HOSPITAL_COMMUNITY): Payer: Medicare Other | Attending: Hematology

## 2020-06-29 DIAGNOSIS — F17210 Nicotine dependence, cigarettes, uncomplicated: Secondary | ICD-10-CM | POA: Diagnosis not present

## 2020-06-29 DIAGNOSIS — E559 Vitamin D deficiency, unspecified: Secondary | ICD-10-CM | POA: Diagnosis not present

## 2020-06-29 DIAGNOSIS — Z122 Encounter for screening for malignant neoplasm of respiratory organs: Secondary | ICD-10-CM | POA: Insufficient documentation

## 2020-06-29 DIAGNOSIS — Z87891 Personal history of nicotine dependence: Secondary | ICD-10-CM | POA: Insufficient documentation

## 2020-06-29 DIAGNOSIS — E538 Deficiency of other specified B group vitamins: Secondary | ICD-10-CM | POA: Diagnosis not present

## 2020-06-29 DIAGNOSIS — D509 Iron deficiency anemia, unspecified: Secondary | ICD-10-CM | POA: Diagnosis not present

## 2020-06-29 LAB — CBC WITH DIFFERENTIAL/PLATELET
Abs Immature Granulocytes: 0.06 10*3/uL (ref 0.00–0.07)
Basophils Absolute: 0.1 10*3/uL (ref 0.0–0.1)
Basophils Relative: 1 %
Eosinophils Absolute: 0.1 10*3/uL (ref 0.0–0.5)
Eosinophils Relative: 1 %
HCT: 49.9 % (ref 39.0–52.0)
Hemoglobin: 16.5 g/dL (ref 13.0–17.0)
Immature Granulocytes: 1 %
Lymphocytes Relative: 41 %
Lymphs Abs: 4.2 10*3/uL — ABNORMAL HIGH (ref 0.7–4.0)
MCH: 33.3 pg (ref 26.0–34.0)
MCHC: 33.1 g/dL (ref 30.0–36.0)
MCV: 100.8 fL — ABNORMAL HIGH (ref 80.0–100.0)
Monocytes Absolute: 0.7 10*3/uL (ref 0.1–1.0)
Monocytes Relative: 6 %
Neutro Abs: 5.3 10*3/uL (ref 1.7–7.7)
Neutrophils Relative %: 50 %
Platelets: 262 10*3/uL (ref 150–400)
RBC: 4.95 MIL/uL (ref 4.22–5.81)
RDW: 13.2 % (ref 11.5–15.5)
WBC: 10.4 10*3/uL (ref 4.0–10.5)
nRBC: 0 % (ref 0.0–0.2)

## 2020-06-29 LAB — FERRITIN: Ferritin: 19 ng/mL — ABNORMAL LOW (ref 24–336)

## 2020-06-29 LAB — VITAMIN D 25 HYDROXY (VIT D DEFICIENCY, FRACTURES): Vit D, 25-Hydroxy: 79.39 ng/mL (ref 30–100)

## 2020-06-29 LAB — VITAMIN B12: Vitamin B-12: 2140 pg/mL — ABNORMAL HIGH (ref 180–914)

## 2020-06-29 LAB — IRON AND TIBC
Iron: 87 ug/dL (ref 45–182)
Saturation Ratios: 25 % (ref 17.9–39.5)
TIBC: 347 ug/dL (ref 250–450)
UIBC: 260 ug/dL

## 2020-06-30 ENCOUNTER — Encounter (HOSPITAL_COMMUNITY): Payer: Self-pay

## 2020-06-30 NOTE — Progress Notes (Signed)
Patient notified of LDCT Lung Cancer Screening Results via mail with the recommendation to follow-up in 12 months. Patient's referring provider has been sent a copy of results. Results are as follows:   IMPRESSION: Lung-RADS 2, benign appearance or behavior. Continue annual screening with low-dose chest CT without contrast in 12 months.   Aortic Atherosclerosis (ICD10-I70.0) and Emphysema (ICD10-J43.9). 

## 2020-07-02 ENCOUNTER — Other Ambulatory Visit: Payer: Self-pay

## 2020-07-02 ENCOUNTER — Inpatient Hospital Stay (HOSPITAL_BASED_OUTPATIENT_CLINIC_OR_DEPARTMENT_OTHER): Payer: Medicare Other | Admitting: Hematology

## 2020-07-02 VITALS — BP 151/78 | HR 100 | Temp 96.8°F | Resp 17 | Wt 168.2 lb

## 2020-07-02 DIAGNOSIS — D509 Iron deficiency anemia, unspecified: Secondary | ICD-10-CM

## 2020-07-02 NOTE — Progress Notes (Signed)
[facility] [address] [place], [place] [address]   CLINIC:  Medical Oncology/Hematology  PCP:  Aggie Cosier, MD [facility] [address] [place] *  [phone]  REASON FOR VISIT:  Follow-up for IDA  PRIOR THERAPY: PRBC on 08/2019  CURRENT THERAPY: Iron tablets QOD  INTERVAL HISTORY:  [name], a 75 y.o. male, returns for routine follow-up for his IDA. Cole Spears was last seen on 04/15/2020.  Today he reports feeling okay. His cough and SOB are stable. He is taking iron tablets every other day since he was constipated with daily iron, which improved with QOD iron tablets. He notes feeling tired constantly which he attributes to his old age.   REVIEW OF SYSTEMS:  Review of Systems  Constitutional: Positive for fatigue (50%). Negative for appetite change.  Respiratory: Positive for cough and shortness of breath.   Cardiovascular: Positive for chest pain.  All other systems reviewed and are negative.   PAST MEDICAL/SURGICAL HISTORY:  Past Medical History:  Diagnosis Date  . Depression   . Diabetes type 2, controlled (HCC)   . GERD (gastroesophageal reflux disease)   . Hypercholesterolemia   . Hypertension   . PVD (peripheral vascular disease) (HCC)    Past Surgical History:  Procedure Laterality Date  . BACK SURGERY     lumbosacral  . CAROTID ARTERY SURGERY Right   . CHOLECYSTECTOMY, LAPAROSCOPIC      SOCIAL HISTORY:  Social History   Socioeconomic History  . Marital status: Married    Spouse name: Not on file  . Number of children: 2  . Years of education: Not on file  . Highest education level: Not on file  Occupational History  . Occupation: retired  Tobacco Use  . Smoking status: Current Every Day Smoker    Packs/day: 0.50    Years: 50.00    Pack years: 25.00    Types: Cigarettes  . Smokeless tobacco: Never Used  Substance and Sexual Activity  . Alcohol use: Yes    Comment: few beers  occasionally  . Drug use: Never  . Sexual activity: Not on file  Other Topics Concern  . Not on file  Social History Narrative  . Not on file   Social Determinants of Health   Financial Resource Strain: Not on file  Food Insecurity: Not on file  Transportation Needs: Not on file  Physical Activity: Not on file  Stress: Not on file  Social Connections: Not on file  Intimate Partner Violence: Not on file    FAMILY HISTORY:  Family History  Problem Relation Age of Onset  . Heart disease Brother   . Blindness Brother   . Colon cancer Neg Hx   . Gastric cancer Neg Hx   . Esophageal cancer Neg Hx     CURRENT MEDICATIONS:  Current Outpatient Medications  Medication Sig Dispense Refill  . aspirin EC 81 MG tablet Take 81 mg by mouth daily. Swallow whole.    Marland Kitchen atorvastatin (LIPITOR) 40 MG tablet Take 40 mg by mouth daily.    Marland Kitchen buPROPion (WELLBUTRIN XL) 150 MG 24 hr tablet Take 150 mg by mouth every morning.    . cilostazol (PLETAL) 100 MG tablet Take 100 mg by mouth 2 (two) times daily.    . clopidogrel (PLAVIX) 75 MG tablet Take 75 mg by mouth daily.    . Cyanocobalamin (B-12 PO) Take 5,000 mcg by mouth daily.    Marland Kitchen docusate sodium (COLACE) 100 MG  capsule Take 100 mg by mouth daily.    . ergocalciferol (VITAMIN D2) 1.25 MG (50000 UT) capsule Take 1 capsule (50,000 Units total) by mouth once a week. 4 capsule 6  . escitalopram (LEXAPRO) 20 MG tablet Take 20 mg by mouth daily.    . ferrous sulfate 325 (65 FE) MG tablet Take 325 mg by mouth daily.    Marland Kitchen glipiZIDE (GLUCOTROL) 5 MG tablet Take 5 mg by mouth daily.    Marland Kitchen omeprazole (PRILOSEC) 40 MG capsule Take 40 mg by mouth daily.     No current facility-administered medications for this visit.    ALLERGIES:  Allergies  Allergen Reactions  . Metformin And Related     diarrhea    PHYSICAL EXAM:  Performance status (ECOG): 0 - Asymptomatic  Vitals:   07/02/20 1450  BP: (!) 151/78  Pulse: 100  Resp: 17  Temp: (!) 96.8 F  (36 C)  SpO2: 94%   Wt Readings from Last 3 Encounters:  07/02/20 168 lb 3.2 oz (76.3 kg)  04/15/20 169 lb 14.4 oz (77.1 kg)  01/15/20 168 lb (76.2 kg)   Physical Exam Vitals reviewed.  Constitutional:      Appearance: Normal appearance.  Cardiovascular:     Rate and Rhythm: Normal rate and regular rhythm.     Pulses: Normal pulses.     Heart sounds: Normal heart sounds.  Pulmonary:     Effort: Pulmonary effort is normal.     Breath sounds: Normal breath sounds.  Neurological:     General: No focal deficit present.     Mental Status: He is alert and oriented to person, place, and time.  Psychiatric:        Mood and Affect: Mood normal.        Behavior: Behavior normal.     LABORATORY DATA:  I have reviewed the labs as listed.  CBC Latest Ref Rng & Units 06/29/2020 04/08/2020 12/05/2019  WBC 4.0 - 10.5 K/uL 10.4 8.3 6.6  Hemoglobin 13.0 - 17.0 g/dL 99.3 57.0 1.7(B)  Hematocrit 39.0 - 52.0 % 49.9 49.3 35.2(L)  Platelets 150 - 400 K/uL 262 272 379   CMP Latest Ref Rng & Units 12/05/2019  Glucose 70 - 99 mg/dL 939(Q)  BUN 8 - 23 mg/dL 12  Creatinine 3.00 - 9.23 mg/dL 3.00  Sodium 762 - 263 mmol/L 137  Potassium 3.5 - 5.1 mmol/L 4.2  Chloride 98 - 111 mmol/L 105  CO2 22 - 32 mmol/L 24  Calcium 8.9 - 10.3 mg/dL 3.3(L)  Total Protein 6.5 - 8.1 g/dL 7.2  Total Bilirubin 0.3 - 1.2 mg/dL 0.5  Alkaline Phos 38 - 126 U/L 79  AST 15 - 41 U/L 14(L)  ALT 0 - 44 U/L 14      Component Value Date/Time   RBC 4.95 06/29/2020 0948   MCV 100.8 (H) 06/29/2020 0948   MCH 33.3 06/29/2020 0948   MCHC 33.1 06/29/2020 0948   RDW 13.2 06/29/2020 0948   LYMPHSABS 4.2 (H) 06/29/2020 0948   MONOABS 0.7 06/29/2020 0948   EOSABS 0.1 06/29/2020 0948   BASOSABS 0.1 06/29/2020 0948   Lab Results  Component Value Date   TIBC 347 06/29/2020   TIBC 333 04/08/2020   TIBC 432 12/05/2019   FERRITIN 19 (L) 06/29/2020   FERRITIN 27 04/08/2020   FERRITIN 27 12/05/2019   IRONPCTSAT 25  06/29/2020   IRONPCTSAT 26 04/08/2020   IRONPCTSAT 6 (L) 12/05/2019   Lab Results  Component Value Date  VD25OH 79.39 06/29/2020   VD25OH 82.32 04/08/2020   VD25OH 18.87 (L) 12/05/2019    DIAGNOSTIC IMAGING:  I have independently reviewed the scans and discussed with the patient. CT CHEST LCS NODULE F/U W/O CONTRAST  Result Date: 06/29/2020 CLINICAL DATA:  75 year old male current smoker, with 70 pack-year history of smoking, for short-term follow-up lung cancer screening EXAM: CT CHEST WITHOUT CONTRAST FOR LUNG CANCER SCREENING NODULE FOLLOW-UP TECHNIQUE: Multidetector CT imaging of the chest was performed following the standard protocol without IV contrast. COMPARISON:  Low-dose lung cancer screening CT chest dated 12/30/2019 FINDINGS: Cardiovascular: The heart is normal in size. No pericardial effusion. No evidence of thoracic aortic aneurysm. Atherosclerotic calcifications of the aortic arch. Three vessel coronary atherosclerosis. Mediastinum/Nodes: Small mediastinal lymph nodes which do not meet pathologic CT size criteria. Visualized thyroid is unremarkable. Lungs/Pleura: Biapical pleural-parenchymal scarring. Mild to moderate centrilobular and paraseptal emphysematous changes, upper lung predominant. No focal consolidation. 6.3 mm flat irregular nodule along the right minor fissure, unchanged. Additional bilateral upper lobe nodules measuring up to 3.6 mm, unchanged. No pleural effusion or pneumothorax. Upper Abdomen: Visualized upper abdomen is notable for prior cholecystectomy and mild vascular calcifications. Musculoskeletal: Visualized osseous structures are within normal limits. IMPRESSION: Lung-RADS 2, benign appearance or behavior. Continue annual screening with low-dose chest CT without contrast in 12 months. Aortic Atherosclerosis (ICD10-I70.0) and Emphysema (ICD10-J43.9). Electronically Signed   By: Charline Bills M.D.   On: 06/29/2020 13:13     ASSESSMENT:  1. Iron  deficiency anemia: -CBC on 11/27/2019 shows hemoglobin 8.9 and MCV of 71. Ferritin was 30. -Patient started taking iron tablet 2 to 3 weeks ago. He has mild constipation. -He reportedly had a colonoscopy, endoscopy and capsule study by Dr. Harl Favor [place] in the last few months. He states that they did not find anything. -He reportedly had 2 units of blood transfusion in [facility] about 3 months ago. -Fecal occult blood test on 11/22/2019 was negative.  2. Tobacco abuse: -Currently smoking half pack per day. Used to smoke 2 packs/day for more than 20 years. -CT lung cancer screening scan on 12/30/2019 was lung RADS 3. Dominant 6.5 mm right upper lobe nodule. 50-month follow-up was recommended.   PLAN:  1. Iron deficiency anemia: -Hemoglobin is 16.5.  Ferritin is 19 and percent saturation is 25. -Continue iron tablet every other day. -RTC 4 months with repeat labs.  2. Smoking history: -Reviewed CT of the chest which showed lung RADS 2. -We will order low-dose chest CT in 1 year.  3. Vitamin B12 deficiency: -We will change B12 to every other day as the level is high.  4. Vitamin D deficiency: -Continue vitamin D.  Level is 79.  Orders placed this encounter:  No orders of the defined types were placed in this encounter.    Doreatha Massed, MD [facility] [phone]   I, Drue Second, am acting as a scribe for Dr. Payton Mccallum.  I, Doreatha Massed MD, have reviewed the above documentation for accuracy and completeness, and I agree with the above.

## 2020-07-02 NOTE — Patient Instructions (Signed)
[facility] at [facility] Discharge Instructions  You were seen today by Dr. Ellin Saba. He went over your recent results and scans. Start taking vitamin B12 1,000 mcg every other day and continue taking iron tablets every other day. Once you finish the weekly vitamin D tablets, start taking 2,000 units daily. Dr. Ellin Saba will see you back in 4 months for labs and follow up.   Thank you for choosing [facility] at [facility] to provide your oncology and hematology care.  To afford each patient quality time with our provider, please arrive at least 15 minutes before your scheduled appointment time.   If you have a lab appointment with the Cancer Center please come in thru the Main Entrance and check in at the main information desk  You need to re-schedule your appointment should you arrive 10 or more minutes late.  We strive to give you quality time with our providers, and arriving late affects you and other patients whose appointments are after yours.  Also, if you no show three or more times for appointments you may be dismissed from the clinic at the providers discretion.     Again, thank you for choosing [facility].  Our hope is that these requests will decrease the amount of time that you wait before being seen by our physicians.       _____________________________________________________________  Should you have questions after your visit to [facility], please contact our office at [phone] between the hours of 8:00 a.m. and 4:30 p.m.  Voicemails left after 4:00 p.m. will not be returned until the following business day.  For prescription refill requests, have your pharmacy contact our office and allow 72 hours.    Cancer Center Support Programs:   > Cancer Support Group  2nd Tuesday of the month 1pm-2pm, Journey Room

## 2020-07-03 ENCOUNTER — Other Ambulatory Visit (HOSPITAL_COMMUNITY): Payer: Self-pay

## 2020-07-03 DIAGNOSIS — D509 Iron deficiency anemia, unspecified: Secondary | ICD-10-CM

## 2020-07-14 ENCOUNTER — Ambulatory Visit: Payer: Medicare Other | Admitting: Nurse Practitioner

## 2020-07-14 ENCOUNTER — Encounter: Payer: Self-pay | Admitting: Internal Medicine

## 2020-07-14 NOTE — Progress Notes (Deleted)
Referring Provider: Aggie Cosier, MD Primary Care Physician:  Aggie Cosier, MD Primary GI:  Dr. Marletta Lor  No chief complaint on file.   HPI:   Cole Spears is a 75 y.o. male who presents for 20-month follow-up.  The patient was last seen in our office 01/15/2020 for IDA and drug-induced constipation.  Previous evaluation in [place], [place] including colonoscopy, EGD which the patient states did not find anything.  He also had a capsule endoscopy a year prior and was not informed of any findings.  Chronic GERD doing well on Prilosec.  Fecal occult blood testing to my office was negative.  CBC in August 2021 with a hemoglobin of 8.9 without baseline to compare, iron low at 23, ferritin low/normal at 30.  Has been following with hematology and started on oral iron with improvement in hemoglobin to 9.5.  Also noted B12 deficiency and started on supplementation.  At his last visit energy improved, still on iron with associated dark stools.  Indicates he was going to get IV iron at his next hematology visit.  Oral iron causes constipation but he manages with Colace.  No other overt GI complaints.  Recommended continue current medications, follow-up with hematology, call us for any obvious GI bleed, follow-up in 6 months.  We requested previous procedural reports and associated pathology but have not received these.  Today states doing okay overall.  Past Medical History:  Diagnosis Date  . Depression   . Diabetes type 2, controlled (HCC)   . GERD (gastroesophageal reflux disease)   . Hypercholesterolemia   . Hypertension   . PVD (peripheral vascular disease) (HCC)     Past Surgical History:  Procedure Laterality Date  . BACK SURGERY     lumbosacral  . CAROTID ARTERY SURGERY Right   . CHOLECYSTECTOMY, LAPAROSCOPIC      Current Outpatient Medications  Medication Sig Dispense Refill  . aspirin EC 81 MG tablet Take 81 mg by mouth daily. Swallow whole.    Marland Kitchen atorvastatin (LIPITOR)  40 MG tablet Take 40 mg by mouth daily.    Marland Kitchen buPROPion (WELLBUTRIN XL) 150 MG 24 hr tablet Take 150 mg by mouth every morning.    . cilostazol (PLETAL) 100 MG tablet Take 100 mg by mouth 2 (two) times daily.    . clopidogrel (PLAVIX) 75 MG tablet Take 75 mg by mouth daily.    . Cyanocobalamin (B-12 PO) Take 5,000 mcg by mouth daily.    Marland Kitchen docusate sodium (COLACE) 100 MG capsule Take 100 mg by mouth daily.    . ergocalciferol (VITAMIN D2) 1.25 MG (50000 UT) capsule Take 1 capsule (50,000 Units total) by mouth once a week. 4 capsule 6  . escitalopram (LEXAPRO) 20 MG tablet Take 20 mg by mouth daily.    . ferrous sulfate 325 (65 FE) MG tablet Take 325 mg by mouth daily.    Marland Kitchen glipiZIDE (GLUCOTROL) 5 MG tablet Take 5 mg by mouth daily.    Marland Kitchen omeprazole (PRILOSEC) 40 MG capsule Take 40 mg by mouth daily.     No current facility-administered medications for this visit.    Allergies as of 07/14/2020 - Review Complete 07/02/2020  Allergen Reaction Noted  . Metformin and related  11/19/2019    Family History  Problem Relation Age of Onset  . Heart disease Brother   . Blindness Brother   . Colon cancer Neg Hx   . Gastric cancer Neg Hx   . Esophageal cancer Neg Hx  Social History   Socioeconomic History  . Marital status: Married    Spouse name: Not on file  . Number of children: 2  . Years of education: Not on file  . Highest education level: Not on file  Occupational History  . Occupation: retired  Tobacco Use  . Smoking status: Current Every Day Smoker    Packs/day: 0.50    Years: 50.00    Pack years: 25.00    Types: Cigarettes  . Smokeless tobacco: Never Used  Substance and Sexual Activity  . Alcohol use: Yes    Comment: few beers occasionally  . Drug use: Never  . Sexual activity: Not on file  Other Topics Concern  . Not on file  Social History Narrative  . Not on file   Social Determinants of Health   Financial Resource Strain: Not on file  Food Insecurity: Not  on file  Transportation Needs: Not on file  Physical Activity: Not on file  Stress: Not on file  Social Connections: Not on file    Subjective:*** Review of Systems  Constitutional: Negative for chills, fever, malaise/fatigue and weight loss.  HENT: Negative for congestion and sore throat.   Respiratory: Negative for cough and shortness of breath.   Cardiovascular: Negative for chest pain and palpitations.  Gastrointestinal: Negative for abdominal pain, blood in stool, diarrhea, melena, nausea and vomiting.  Musculoskeletal: Negative for joint pain and myalgias.  Skin: Negative for rash.  Neurological: Negative for dizziness and weakness.  Endo/Heme/Allergies: Does not bruise/bleed easily.  Psychiatric/Behavioral: Negative for depression. The patient is not nervous/anxious.   All other systems reviewed and are negative.    Objective: There were no vitals taken for this visit. Physical Exam Vitals and nursing note reviewed.  Constitutional:      General: He is not in acute distress.    Appearance: Normal appearance. He is not ill-appearing, toxic-appearing or diaphoretic.  HENT:     Head: Normocephalic and atraumatic.     Nose: No congestion or rhinorrhea.  Eyes:     General: No scleral icterus. Cardiovascular:     Rate and Rhythm: Normal rate and regular rhythm.     Heart sounds: Normal heart sounds.  Pulmonary:     Effort: Pulmonary effort is normal.     Breath sounds: Normal breath sounds.  Abdominal:     General: Bowel sounds are normal. There is no distension.     Palpations: Abdomen is soft. There is no hepatomegaly, splenomegaly or mass.     Tenderness: There is no abdominal tenderness. There is no guarding or rebound.     Hernia: No hernia is present.  Musculoskeletal:     Cervical back: Neck supple.  Skin:    General: Skin is warm and dry.     Coloration: Skin is not jaundiced.     Findings: No bruising or rash.  Neurological:     General: No focal deficit  present.     Mental Status: He is alert and oriented to person, place, and time. Mental status is at baseline.  Psychiatric:        Mood and Affect: Mood normal.        Behavior: Behavior normal.        Thought Content: Thought content normal.      Assessment:  ***   Plan: ***    Thank you for allowing [place] to participate in the care of Cranston Neighbor, DNP, AGNP-C Adult & Gerontological Nurse Practitioner [facility]   07/14/2020 12:41 PM   Disclaimer: This note was dictated with voice recognition software. Similar sounding words can inadvertently be transcribed and may not be corrected upon review.

## 2020-07-20 ENCOUNTER — Other Ambulatory Visit (HOSPITAL_COMMUNITY): Payer: Self-pay | Admitting: Hematology

## 2020-08-26 ENCOUNTER — Other Ambulatory Visit (HOSPITAL_COMMUNITY): Payer: Medicare Other

## 2020-09-02 ENCOUNTER — Ambulatory Visit (HOSPITAL_COMMUNITY): Payer: Medicare Other | Admitting: Hematology

## 2020-10-27 ENCOUNTER — Other Ambulatory Visit: Payer: Self-pay

## 2020-10-27 ENCOUNTER — Inpatient Hospital Stay (HOSPITAL_COMMUNITY): Payer: Medicare Other | Attending: Hematology

## 2020-10-27 DIAGNOSIS — D509 Iron deficiency anemia, unspecified: Secondary | ICD-10-CM | POA: Diagnosis not present

## 2020-10-27 DIAGNOSIS — E538 Deficiency of other specified B group vitamins: Secondary | ICD-10-CM | POA: Diagnosis not present

## 2020-10-27 DIAGNOSIS — E559 Vitamin D deficiency, unspecified: Secondary | ICD-10-CM | POA: Insufficient documentation

## 2020-10-27 DIAGNOSIS — Z79899 Other long term (current) drug therapy: Secondary | ICD-10-CM | POA: Insufficient documentation

## 2020-10-27 DIAGNOSIS — F17210 Nicotine dependence, cigarettes, uncomplicated: Secondary | ICD-10-CM | POA: Insufficient documentation

## 2020-10-27 LAB — CBC WITH DIFFERENTIAL/PLATELET
Abs Immature Granulocytes: 0.03 10*3/uL (ref 0.00–0.07)
Basophils Absolute: 0.1 10*3/uL (ref 0.0–0.1)
Basophils Relative: 1 %
Eosinophils Absolute: 0.1 10*3/uL (ref 0.0–0.5)
Eosinophils Relative: 1 %
HCT: 47.2 % (ref 39.0–52.0)
Hemoglobin: 15.9 g/dL (ref 13.0–17.0)
Immature Granulocytes: 0 %
Lymphocytes Relative: 42 %
Lymphs Abs: 3.7 10*3/uL (ref 0.7–4.0)
MCH: 34 pg (ref 26.0–34.0)
MCHC: 33.7 g/dL (ref 30.0–36.0)
MCV: 100.9 fL — ABNORMAL HIGH (ref 80.0–100.0)
Monocytes Absolute: 0.5 10*3/uL (ref 0.1–1.0)
Monocytes Relative: 6 %
Neutro Abs: 4.4 10*3/uL (ref 1.7–7.7)
Neutrophils Relative %: 50 %
Platelets: 269 10*3/uL (ref 150–400)
RBC: 4.68 MIL/uL (ref 4.22–5.81)
RDW: 13.9 % (ref 11.5–15.5)
WBC: 8.8 10*3/uL (ref 4.0–10.5)
nRBC: 0 % (ref 0.0–0.2)

## 2020-10-27 LAB — IRON AND TIBC
Iron: 87 ug/dL (ref 45–182)
Saturation Ratios: 28 % (ref 17.9–39.5)
TIBC: 315 ug/dL (ref 250–450)
UIBC: 228 ug/dL

## 2020-10-27 LAB — FERRITIN: Ferritin: 23 ng/mL — ABNORMAL LOW (ref 24–336)

## 2020-10-27 LAB — VITAMIN D 25 HYDROXY (VIT D DEFICIENCY, FRACTURES): Vit D, 25-Hydroxy: 99.42 ng/mL (ref 30–100)

## 2020-11-03 ENCOUNTER — Inpatient Hospital Stay (HOSPITAL_BASED_OUTPATIENT_CLINIC_OR_DEPARTMENT_OTHER): Payer: Medicare Other | Admitting: Hematology and Oncology

## 2020-11-03 ENCOUNTER — Other Ambulatory Visit: Payer: Self-pay

## 2020-11-03 ENCOUNTER — Encounter (HOSPITAL_COMMUNITY): Payer: Self-pay | Admitting: Hematology and Oncology

## 2020-11-03 VITALS — BP 144/66 | HR 86 | Temp 96.9°F | Resp 18 | Wt 165.0 lb

## 2020-11-03 DIAGNOSIS — D509 Iron deficiency anemia, unspecified: Secondary | ICD-10-CM | POA: Diagnosis not present

## 2020-11-03 NOTE — Progress Notes (Signed)
[facility] [address], [place] [address]   CLINIC:  Medical Oncology/Hematology  PCP:  Aggie Cosier, MD [facility] [address] [place] *  [phone]  REASON FOR VISIT:  Follow-up for IDA  PRIOR THERAPY: PRBC on 08/2019  CURRENT THERAPY: Iron tablets QOD  INTERVAL HISTORY:  Mr. Ernesto Zukowski, a 75 y.o. male, returns for routine follow-up for his IDA. Kiree was last seen on 07/02/20.  On exam today Mr. Geral reports been tolerating the every other day iron quite well.  He notes that previously when he was taking daily doses his stool was hard and he would get backed up.  He notes that he has not been having issues with nausea, vomiting, or diarrhea.  He notes he takes a probiotic gummy every day to try to help with the stools long.  He does endorse continuing to have low energy.  He does not do a whole lot but he is able to mow the lawn which is 1 acre in size.  He does endorse having continued shortness of breath though he attributes this to his lack of exercise.  He has not been having any issues with bleeding overtly.  He does have bruises on his arms and lower extremities which he is unsure how he picks up.  A full 10 point ROS is listed below.   REVIEW OF SYSTEMS:  Review of Systems  Constitutional:  Positive for fatigue (50%). Negative for appetite change.  Respiratory:  Positive for cough and shortness of breath.   Cardiovascular:  Positive for chest pain.  All other systems reviewed and are negative.  PAST MEDICAL/SURGICAL HISTORY:  Past Medical History:  Diagnosis Date   Depression    Diabetes type 2, controlled (HCC)    GERD (gastroesophageal reflux disease)    Hypercholesterolemia    Hypertension    PVD (peripheral vascular disease) (HCC)    Past Surgical History:  Procedure Laterality Date   BACK SURGERY     lumbosacral   CAROTID ARTERY SURGERY Right    CHOLECYSTECTOMY, LAPAROSCOPIC      SOCIAL  HISTORY:  Social History   Socioeconomic History   Marital status: Married    Spouse name: Not on file   Number of children: 2   Years of education: Not on file   Highest education level: Not on file  Occupational History   Occupation: retired  Tobacco Use   Smoking status: Every Day    Packs/day: 0.50    Years: 50.00    Pack years: 25.00    Types: Cigarettes   Smokeless tobacco: Never  Substance and Sexual Activity   Alcohol use: Yes    Comment: few beers occasionally   Drug use: Never   Sexual activity: Not on file  Other Topics Concern   Not on file  Social History Narrative   Not on file   Social Determinants of Health   Financial Resource Strain: Not on file  Food Insecurity: Not on file  Transportation Needs: Not on file  Physical Activity: Not on file  Stress: Not on file  Social Connections: Not on file  Intimate Partner Violence: Not on file    FAMILY HISTORY:  Family History  Problem Relation Age of Onset   Heart disease Brother    Blindness Brother    Colon cancer Neg Hx    Gastric cancer Neg Hx    Esophageal cancer Neg Hx     CURRENT MEDICATIONS:  Current Outpatient Medications  Medication Sig Dispense Refill   aspirin EC 81 MG tablet Take 81 mg by mouth daily. Swallow whole.     atorvastatin (LIPITOR) 40 MG tablet Take 40 mg by mouth daily.     buPROPion (WELLBUTRIN XL) 150 MG 24 hr tablet Take 150 mg by mouth every morning.     cilostazol (PLETAL) 100 MG tablet Take 100 mg by mouth 2 (two) times daily.     clopidogrel (PLAVIX) 75 MG tablet Take 75 mg by mouth daily.     Cyanocobalamin (B-12 PO) Take 5,000 mcg by mouth daily.     docusate sodium (COLACE) 100 MG capsule Take 100 mg by mouth daily.     escitalopram (LEXAPRO) 20 MG tablet Take 20 mg by mouth daily.     ferrous sulfate 325 (65 FE) MG tablet Take 325 mg by mouth daily.     glipiZIDE (GLUCOTROL) 5 MG tablet Take 5 mg by mouth daily.     losartan (COZAAR) 25 MG tablet Take 25 mg by  mouth daily.     omeprazole (PRILOSEC) 40 MG capsule Take 40 mg by mouth daily.     Vitamin D, Ergocalciferol, (DRISDOL) 1.25 MG (50000 UNIT) CAPS capsule TAKE 1 CAPSULE BY MOUTH ONE TIME PER WEEK 12 capsule 2   No current facility-administered medications for this visit.    ALLERGIES:  Allergies  Allergen Reactions   Metformin And Related     diarrhea    PHYSICAL EXAM:  Performance status (ECOG): 0 - Asymptomatic  Vitals:   11/03/20 1359  BP: (!) 144/66  Pulse: 86  Resp: 18  Temp: (!) 96.9 F (36.1 C)  SpO2: 98%   Wt Readings from Last 3 Encounters:  11/03/20 165 lb (74.8 kg)  07/02/20 168 lb 3.2 oz (76.3 kg)  04/15/20 169 lb 14.4 oz (77.1 kg)   Physical Exam Vitals reviewed.  Constitutional:      Appearance: Normal appearance.  Cardiovascular:     Rate and Rhythm: Normal rate and regular rhythm.     Pulses: Normal pulses.     Heart sounds: Normal heart sounds.  Pulmonary:     Effort: Pulmonary effort is normal.     Breath sounds: Normal breath sounds.  Neurological:     General: No focal deficit present.     Mental Status: He is alert and oriented to person, place, and time.  Psychiatric:        Mood and Affect: Mood normal.        Behavior: Behavior normal.    LABORATORY DATA:  I have reviewed the labs as listed.  CBC Latest Ref Rng & Units 10/27/2020 06/29/2020 04/08/2020  WBC 4.0 - 10.5 K/uL 8.8 10.4 8.3  Hemoglobin 13.0 - 17.0 g/dL 23.5 57.3 22.0  Hematocrit 39.0 - 52.0 % 47.2 49.9 49.3  Platelets 150 - 400 K/uL 269 262 272   CMP Latest Ref Rng & Units 12/05/2019  Glucose 70 - 99 mg/dL 254(Y)  BUN 8 - 23 mg/dL 12  Creatinine 7.06 - 2.37 mg/dL 6.28  Sodium 315 - 176 mmol/L 137  Potassium 3.5 - 5.1 mmol/L 4.2  Chloride 98 - 111 mmol/L 105  CO2 22 - 32 mmol/L 24  Calcium 8.9 - 10.3 mg/dL 1.6(W)  Total Protein 6.5 - 8.1 g/dL 7.2  Total Bilirubin 0.3 - 1.2 mg/dL 0.5  Alkaline Phos 38 - 126 U/L 79  AST 15 - 41 U/L 14(L)  ALT 0 - 44 U/L 14  Component Value Date/Time   RBC 4.68 10/27/2020 1331   MCV 100.9 (H) 10/27/2020 1331   MCH 34.0 10/27/2020 1331   MCHC 33.7 10/27/2020 1331   RDW 13.9 10/27/2020 1331   LYMPHSABS 3.7 10/27/2020 1331   MONOABS 0.5 10/27/2020 1331   EOSABS 0.1 10/27/2020 1331   BASOSABS 0.1 10/27/2020 1331   Lab Results  Component Value Date   TIBC 315 10/27/2020   TIBC 347 06/29/2020   TIBC 333 04/08/2020   FERRITIN 23 (L) 10/27/2020   FERRITIN 19 (L) 06/29/2020   FERRITIN 27 04/08/2020   IRONPCTSAT 28 10/27/2020   IRONPCTSAT 25 06/29/2020   IRONPCTSAT 26 04/08/2020   Lab Results  Component Value Date   VD25OH 99.42 10/27/2020   VD25OH 79.39 06/29/2020   VD25OH 82.32 04/08/2020    DIAGNOSTIC IMAGING:  I have independently reviewed the scans and discussed with the patient. No results found.    ASSESSMENT:  1.  Iron deficiency anemia: -CBC on 11/27/2019 shows hemoglobin 8.9 and MCV of 71.  Ferritin was 30. -Patient started taking iron tablet 2 to 3 weeks ago.  He has mild constipation. -He reportedly had a colonoscopy, endoscopy and capsule study by Dr. Samuella Cota in [place] in the last few months.  He states that they did not find anything. -He reportedly had 2 units of blood transfusion in [facility] about 3 months ago. -Fecal occult blood test on 11/22/2019 was negative.   2.  Tobacco abuse: -Currently smoking half pack per day.  Used to smoke 2 packs/day for more than 20 years. -CT lung cancer screening scan on 12/30/2019 was lung RADS 3.  Dominant 6.5 mm right upper lobe nodule.  38-month follow-up was recommended.  PLAN:  1.  Iron deficiency anemia: -Hemoglobin is 15.9.  Ferritin is 23 and percent saturation is 28. -Continue iron tablet every other day. -RTC 6 months with repeat labs.   2.  Smoking history: -Reviewed CT of the chest which showed lung RADS 2. -We will order low-dose chest CT in 1 year. Next due 06/2021   3.  Vitamin B12 deficiency: -We will change  B12 to every other day as the level is high.   4.  Vitamin D deficiency: -Continue vitamin D.  Level is 99.  Orders placed this encounter:  No orders of the defined types were placed in this encounter.  Ulysees Barns, MD Department of Hematology/Oncology [facility] Phone: [phone] Pager: [phone] Email: [email or web address]

## 2020-11-09 ENCOUNTER — Other Ambulatory Visit (HOSPITAL_COMMUNITY): Payer: Self-pay

## 2020-11-09 DIAGNOSIS — D509 Iron deficiency anemia, unspecified: Secondary | ICD-10-CM

## 2020-12-29 ENCOUNTER — Ambulatory Visit (HOSPITAL_COMMUNITY): Payer: Medicare Other

## 2021-03-31 ENCOUNTER — Other Ambulatory Visit (HOSPITAL_COMMUNITY): Payer: Self-pay | Admitting: Hematology

## 2021-05-03 ENCOUNTER — Inpatient Hospital Stay (HOSPITAL_COMMUNITY): Payer: Medicare Other | Attending: Hematology

## 2021-05-03 ENCOUNTER — Other Ambulatory Visit: Payer: Self-pay

## 2021-05-03 DIAGNOSIS — D509 Iron deficiency anemia, unspecified: Secondary | ICD-10-CM | POA: Insufficient documentation

## 2021-05-03 DIAGNOSIS — E559 Vitamin D deficiency, unspecified: Secondary | ICD-10-CM | POA: Insufficient documentation

## 2021-05-03 DIAGNOSIS — E538 Deficiency of other specified B group vitamins: Secondary | ICD-10-CM | POA: Insufficient documentation

## 2021-05-03 LAB — COMPREHENSIVE METABOLIC PANEL
ALT: 20 U/L (ref 0–44)
AST: 16 U/L (ref 15–41)
Albumin: 4.2 g/dL (ref 3.5–5.0)
Alkaline Phosphatase: 83 U/L (ref 38–126)
Anion gap: 7 (ref 5–15)
BUN: 16 mg/dL (ref 8–23)
CO2: 25 mmol/L (ref 22–32)
Calcium: 9.1 mg/dL (ref 8.9–10.3)
Chloride: 105 mmol/L (ref 98–111)
Creatinine, Ser: 0.97 mg/dL (ref 0.61–1.24)
GFR, Estimated: >60 mL/min (ref >60)
Glucose, Bld: 194 mg/dL — ABNORMAL HIGH (ref 70–99)
Potassium: 4.2 mmol/L (ref 3.5–5.1)
Sodium: 137 mmol/L (ref 135–145)
Total Bilirubin: 0.5 mg/dL (ref 0.3–1.2)
Total Protein: 7.3 g/dL (ref 6.5–8.1)

## 2021-05-03 LAB — VITAMIN D 25 HYDROXY (VIT D DEFICIENCY, FRACTURES): Vit D, 25-Hydroxy: 70.54 ng/mL (ref 30–100)

## 2021-05-03 LAB — IRON AND TIBC
Iron: 112 ug/dL (ref 45–182)
Saturation Ratios: 34 % (ref 17.9–39.5)
TIBC: 332 ug/dL (ref 250–450)
UIBC: 220 ug/dL

## 2021-05-03 LAB — VITAMIN B12: Vitamin B-12: 2176 pg/mL — ABNORMAL HIGH (ref 180–914)

## 2021-05-03 LAB — CBC WITH DIFFERENTIAL/PLATELET
Abs Immature Granulocytes: 0.03 10*3/uL (ref 0.00–0.07)
Basophils Absolute: 0.1 10*3/uL (ref 0.0–0.1)
Basophils Relative: 1 %
Eosinophils Absolute: 0.1 10*3/uL (ref 0.0–0.5)
Eosinophils Relative: 1 %
HCT: 46.6 % (ref 39.0–52.0)
Hemoglobin: 15.5 g/dL (ref 13.0–17.0)
Immature Granulocytes: 0 %
Lymphocytes Relative: 37 %
Lymphs Abs: 3.3 10*3/uL (ref 0.7–4.0)
MCH: 32.7 pg (ref 26.0–34.0)
MCHC: 33.3 g/dL (ref 30.0–36.0)
MCV: 98.3 fL (ref 80.0–100.0)
Monocytes Absolute: 0.5 10*3/uL (ref 0.1–1.0)
Monocytes Relative: 6 %
Neutro Abs: 4.9 10*3/uL (ref 1.7–7.7)
Neutrophils Relative %: 55 %
Platelets: 250 10*3/uL (ref 150–400)
RBC: 4.74 MIL/uL (ref 4.22–5.81)
RDW: 13.4 % (ref 11.5–15.5)
WBC: 8.9 10*3/uL (ref 4.0–10.5)
nRBC: 0 % (ref 0.0–0.2)

## 2021-05-03 LAB — FERRITIN: Ferritin: 28 ng/mL (ref 24–336)

## 2021-05-07 NOTE — Progress Notes (Deleted)
RESCHEDULED

## 2021-05-10 ENCOUNTER — Inpatient Hospital Stay (HOSPITAL_COMMUNITY): Payer: Medicare Other | Admitting: Physician Assistant

## 2021-05-26 ENCOUNTER — Encounter (HOSPITAL_COMMUNITY): Payer: Self-pay

## 2021-05-26 NOTE — Progress Notes (Signed)
Patient discharged from [facility] program due to age, provider aware.

## 2021-05-28 NOTE — Progress Notes (Signed)
[place], [place] [address]   CLINIC:  Medical Oncology/Hematology  PCP:  Kennieth Rad, MD [facility] [address] [place] [address] [phone]   REASON FOR VISIT:  Follow-up for iron deficiency anemia  PRIOR THERAPY: PRBC transfusion in December 2020  CURRENT THERAPY: Iron tablets every other day  INTERVAL HISTORY:  Mr. Beymer 76 y.o. male returns for routine follow-up of his iron deficiency anemia.  He was last seen by Dr. Lorenso Courier on 11/03/2020.  At today's visit, he reports feeling fairly well.  No recent hospitalizations, surgeries, or changes in baseline health status.  He denies any he reports that his stools are chronically dark from iron pill, but denies any frank melena, hematochezia, hematemesis, or epistaxis.  He reports baseline fatigue with energy about 65%.  He has some restless legs.  He denies any pica, headaches, chest pain, dyspnea on exertion, lightheadedness, or syncope.  He has 65% energy and 100% appetite. He endorses that he is maintaining a stable weight.   REVIEW OF SYSTEMS:  Review of Systems  Constitutional:  Positive for fatigue. Negative for appetite change, chills, diaphoresis, fever and unexpected weight change.  HENT:   Negative for lump/mass and nosebleeds.   Eyes:  Negative for eye problems.  Respiratory:  Negative for cough, hemoptysis and shortness of breath.   Cardiovascular:  Negative for chest pain, leg swelling and palpitations.  Gastrointestinal:  Negative for abdominal pain, blood in stool, constipation, diarrhea, nausea and vomiting.  Genitourinary:  Positive for frequency. Negative for dyspareunia, hematuria and nocturia.   Skin: Negative.   Neurological:  Positive for numbness. Negative for dizziness, headaches and light-headedness.  Hematological:  Does not bruise/bleed easily.  Psychiatric/Behavioral:  Negative for decreased concentration and depression. The patient  is nervous/anxious.      PAST MEDICAL/SURGICAL HISTORY:  Past Medical History:  Diagnosis Date   Depression    Diabetes type 2, controlled (HCC)    GERD (gastroesophageal reflux disease)    Hypercholesterolemia    Hypertension    PVD (peripheral vascular disease) (HCC)    Past Surgical History:  Procedure Laterality Date   BACK SURGERY     lumbosacral   CAROTID ARTERY SURGERY Right    CHOLECYSTECTOMY, LAPAROSCOPIC       SOCIAL HISTORY:  Social History   Socioeconomic History   Marital status: Married    Spouse name: Not on file   Number of children: 2   Years of education: Not on file   Highest education level: Not on file  Occupational History   Occupation: retired  Tobacco Use   Smoking status: Every Day    Packs/day: 0.50    Years: 50.00    Pack years: 25.00    Types: Cigarettes   Smokeless tobacco: Never  Substance and Sexual Activity   Alcohol use: Yes    Comment: few beers occasionally   Drug use: Never   Sexual activity: Not on file  Other Topics Concern   Not on file  Social History Narrative   Not on file   Social Determinants of Health   Financial Resource Strain: Not on file  Food Insecurity: Not on file  Transportation Needs: Not on file  Physical Activity: Not on file  Stress: Not on file  Social Connections: Not on file  Intimate Partner Violence: Not on file    FAMILY HISTORY:  Family History  Problem Relation Age of Onset   Heart disease Brother  Blindness Brother    Colon cancer Neg Hx    Gastric cancer Neg Hx    Esophageal cancer Neg Hx     CURRENT MEDICATIONS:  Outpatient Encounter Medications as of 05/31/2021  Medication Sig   aspirin EC 81 MG tablet Take 81 mg by mouth daily. Swallow whole.   atorvastatin (LIPITOR) 40 MG tablet Take 40 mg by mouth daily.   buPROPion (WELLBUTRIN XL) 150 MG 24 hr tablet Take 150 mg by mouth every morning.   cilostazol (PLETAL) 100 MG tablet Take 100 mg by mouth 2 (two) times daily.    clopidogrel (PLAVIX) 75 MG tablet Take 75 mg by mouth daily.   Cyanocobalamin (B-12 PO) Take 5,000 mcg by mouth daily.   docusate sodium (COLACE) 100 MG capsule Take 100 mg by mouth daily.   escitalopram (LEXAPRO) 20 MG tablet Take 20 mg by mouth daily.   ferrous sulfate 325 (65 FE) MG tablet Take 325 mg by mouth daily.   glipiZIDE (GLUCOTROL) 5 MG tablet Take 5 mg by mouth daily.   losartan (COZAAR) 25 MG tablet Take 25 mg by mouth daily.   omeprazole (PRILOSEC) 40 MG capsule Take 40 mg by mouth daily.   Vitamin D, Ergocalciferol, (DRISDOL) 1.25 MG (50000 UNIT) CAPS capsule TAKE 1 CAPSULE BY MOUTH ONE TIME PER WEEK   No facility-administered encounter medications on file as of 05/31/2021.    ALLERGIES:  Allergies  Allergen Reactions   Metformin And Related     diarrhea     PHYSICAL EXAM:  ECOG PERFORMANCE STATUS: 1 - Symptomatic but completely ambulatory  There were no vitals filed for this visit. There were no vitals filed for this visit. Physical Exam Constitutional:      Appearance: Normal appearance.  HENT:     Head: Normocephalic and atraumatic.     Mouth/Throat:     Mouth: Mucous membranes are moist.  Eyes:     Extraocular Movements: Extraocular movements intact.     Pupils: Pupils are equal, round, and reactive to light.  Cardiovascular:     Rate and Rhythm: Normal rate and regular rhythm.     Pulses: Normal pulses.     Heart sounds: Normal heart sounds.  Pulmonary:     Effort: Pulmonary effort is normal.     Breath sounds: Normal breath sounds.  Abdominal:     General: Bowel sounds are normal.     Palpations: Abdomen is soft.     Tenderness: There is no abdominal tenderness.  Musculoskeletal:        General: No swelling.     Right lower leg: No edema.     Left lower leg: No edema.  Lymphadenopathy:     Cervical: No cervical adenopathy.  Skin:    General: Skin is warm and dry.  Neurological:     General: No focal deficit present.     Mental Status: He  is alert and oriented to person, place, and time.  Psychiatric:        Mood and Affect: Mood normal.        Behavior: Behavior normal.     LABORATORY DATA:  I have reviewed the labs as listed.  CBC    Component Value Date/Time   WBC 8.9 05/03/2021 1404   RBC 4.74 05/03/2021 1404   HGB 15.5 05/03/2021 1404   HCT 46.6 05/03/2021 1404   PLT 250 05/03/2021 1404   MCV 98.3 05/03/2021 1404   MCH 32.7 05/03/2021 1404   MCHC 33.3 05/03/2021  1404   RDW 13.4 05/03/2021 1404   LYMPHSABS 3.3 05/03/2021 1404   MONOABS 0.5 05/03/2021 1404   EOSABS 0.1 05/03/2021 1404   BASOSABS 0.1 05/03/2021 1404   CMP Latest Ref Rng & Units 05/03/2021 12/05/2019  Glucose 70 - 99 mg/dL 194(H) 157(H)  BUN 8 - 23 mg/dL 16 12  Creatinine 0.61 - 1.24 mg/dL 0.97 0.96  Sodium 135 - 145 mmol/L 137 137  Potassium 3.5 - 5.1 mmol/L 4.2 4.2  Chloride 98 - 111 mmol/L 105 105  CO2 22 - 32 mmol/L 25 24  Calcium 8.9 - 10.3 mg/dL 9.1 8.7(L)  Total Protein 6.5 - 8.1 g/dL 7.3 7.2  Total Bilirubin 0.3 - 1.2 mg/dL 0.5 0.5  Alkaline Phos 38 - 126 U/L 83 79  AST 15 - 41 U/L 16 14(L)  ALT 0 - 44 U/L 20 14    DIAGNOSTIC IMAGING:  I have independently reviewed the relevant imaging and discussed with the patient.  ASSESSMENT & PLAN: 1.  Iron deficiency anemia: - He reportedly had 2 units of blood transfusion in [facility] in December 2020 - EGD/colonoscopy (04/24/2019 - Dr. 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  Ferritin was 30. - Fecal occult blood test on 11/22/2019 was negative. - SPEP was negative - He is taking oral ferrous sulfate every other day, with some constipation managed by Colace.  He has never had an IV iron in the past. - No bright red blood per rectum or melena - Most recent labs (05/03/2021): Hgb 15.5, ferritin 28, iron saturation 34% - PLAN: Increase iron tablet to  once daily with stool softener and orange juice.   - Repeat labs and RTC in 6 months  2.  Vitamin D deficiency - He is taking vitamin D 50,000 units weekly - Most recent vitamin D (05/03/2021) is a 70.54 - PLAN: Continue vitamin D 50,000 units weekly  3.  Vitamin B12 deficiency - He is taking vitamin B12 5000 units daily - Most recent vitamin B12 (05/03/2021) remains elevated at 2176 - PLAN: Patient instructed to decrease vitamin B12 to 1000 mcg daily.    4.  Tobacco abuse: - Currently smoking half pack per day.  Used to smoke 2 packs/day for more than 20 years. -CT lung cancer screening scan on 12/30/2019 was lung RADS 3.  Dominant 6.5 mm right upper lobe nodule.  26-month follow-up was recommended. - Repeat CT chest without contrast (06/29/2020): Lung RADS 2, benign appearance or behavior. - PLAN: Patient has aged out of lung cancer screening program due to age 54.   PLAN SUMMARY & DISPOSITION: Labs in 6 months RTC after labs  All questions were answered. The patient knows to call the clinic with any problems, questions or concerns.  Medical decision making: Moderate  Time spent on visit: I spent 20 minutes counseling the patient face to face. The total time spent in the appointment was 30 minutes and more than 50% was on counseling.   Harriett Rush, PA-C  05/31/2021 3:45 PM

## 2021-05-31 ENCOUNTER — Other Ambulatory Visit: Payer: Self-pay

## 2021-05-31 ENCOUNTER — Inpatient Hospital Stay (HOSPITAL_COMMUNITY): Payer: Medicare Other | Attending: Hematology | Admitting: Physician Assistant

## 2021-05-31 VITALS — BP 146/80 | HR 74 | Temp 98.7°F | Resp 18 | Ht 70.5 in | Wt 168.3 lb

## 2021-05-31 DIAGNOSIS — D509 Iron deficiency anemia, unspecified: Secondary | ICD-10-CM | POA: Diagnosis not present

## 2021-05-31 DIAGNOSIS — F17210 Nicotine dependence, cigarettes, uncomplicated: Secondary | ICD-10-CM | POA: Diagnosis not present

## 2021-05-31 DIAGNOSIS — E559 Vitamin D deficiency, unspecified: Secondary | ICD-10-CM | POA: Insufficient documentation

## 2021-05-31 DIAGNOSIS — R5383 Other fatigue: Secondary | ICD-10-CM | POA: Diagnosis not present

## 2021-05-31 DIAGNOSIS — E538 Deficiency of other specified B group vitamins: Secondary | ICD-10-CM | POA: Insufficient documentation

## 2021-05-31 DIAGNOSIS — Z79899 Other long term (current) drug therapy: Secondary | ICD-10-CM | POA: Insufficient documentation

## 2021-05-31 MED ORDER — B-12 1000 MCG PO TABS: 1000.0000 ug | ORAL_TABLET | Freq: Every day | ORAL | Status: AC

## 2021-05-31 NOTE — Patient Instructions (Signed)
[facility] at [facility] Discharge Instructions  You were seen today by Rojelio Brenner PA-C for your iron deficiency.    MEDICATIONS: - Start taking iron supplement EVERY day along with stool softener.  Take with a small glass of orange juice in the morning to help improve absorption. - DECREASE the dose of your vitamin B12 (cyanocobalamin) to 1000 mcg daily (instead of 5000 mcg daily).  This is available over-the-counter. - Continue taking your vitamin D 50,000 units once per week.  LABS: Return in 6 months for repeat labs  FOLLOW-UP APPOINTMENT: Office visit after labs   Thank you for choosing [facility] at [facility] to provide your oncology and hematology care.  To afford each patient quality time with our provider, please arrive at least 15 minutes before your scheduled appointment time.   If you have a lab appointment with the Cancer Center please come in thru the Main Entrance and check in at the main information desk.  You need to re-schedule your appointment should you arrive 10 or more minutes late.  We strive to give you quality time with our providers, and arriving late affects you and other patients whose appointments are after yours.  Also, if you no show three or more times for appointments you may be dismissed from the clinic at the providers discretion.     Again, thank you for choosing [facility].  Our hope is that these requests will decrease the amount of time that you wait before being seen by our physicians.       _____________________________________________________________  Should you have questions after your visit to [facility], please contact our office at [phone] and follow the prompts.  Our office hours are 8:00 a.m. and 4:30 p.m. Monday - Friday.  Please note that voicemails left after 4:00 p.m. may not be returned until the following business day.  We are closed weekends and  major holidays.  You do have access to a nurse 24-7, just call the main number to the clinic [phone] and do not press any options, hold on the line and a nurse will answer the phone.    For prescription refill requests, have your pharmacy contact our office and allow 72 hours.    Due to Covid, you will need to wear a mask upon entering the hospital. If you do not have a mask, a mask will be given to you at the Main Entrance upon arrival. For doctor visits, patients may have 1 support person age 52 or older with them. For treatment visits, patients can not have anyone with them due to social distancing guidelines and our immunocompromised population.

## 2021-11-29 ENCOUNTER — Inpatient Hospital Stay: Payer: Medicare Other | Attending: Physician Assistant

## 2021-12-06 ENCOUNTER — Inpatient Hospital Stay: Payer: Medicare Other | Admitting: Physician Assistant

## 2022-02-25 LAB — METHYLMALONIC ACID, SERUM: Methylmalonic Acid, Quantitative: 372 nmol/L (ref 0–378)

## 2022-03-15 IMAGING — CT CT CHEST LCS NODULE FOLLOW-UP W/O CM
1 series · 10 of 10 positions shown, 13 images · non-contrast
Comparison: Low-dose lung cancer screening CT chest dated
12/30/2019

CLINICAL DATA: 74-year-old male current smoker, with 70 pack-year
history of smoking, for short-term follow-up lung cancer screening

EXAM:
CT CHEST WITHOUT CONTRAST FOR LUNG CANCER SCREENING NODULE FOLLOW-UP
TECHNIQUE: Multidetector CT imaging of the chest was performed following the
standard protocol without IV contrast.

[ct lung segmentation data · axial · 0.76mm/px · z∈[+1087,+1087]mm · 10 of 322 frames shown]
[frame 1/322  mediastinal]
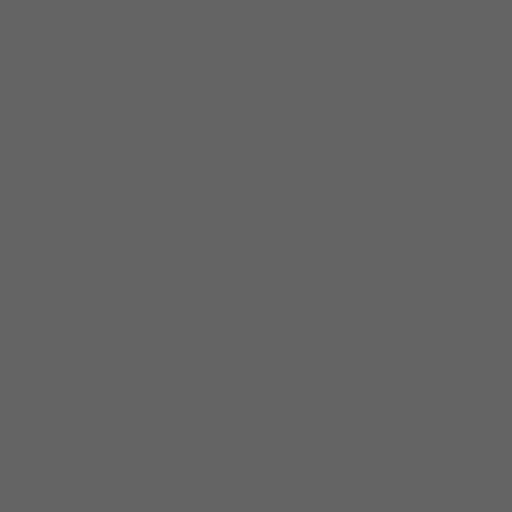
[frame 1/322  lung]
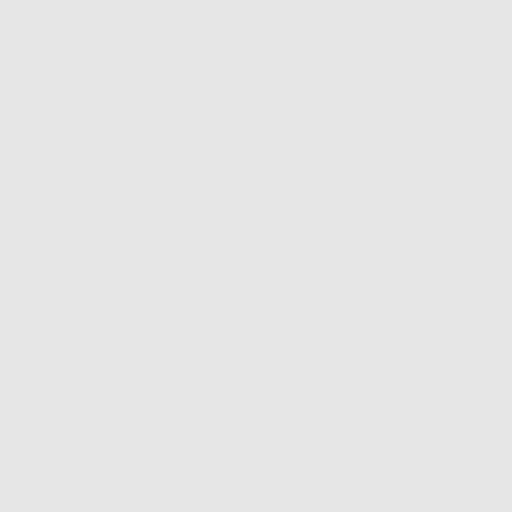
[frame 36/322  lung]
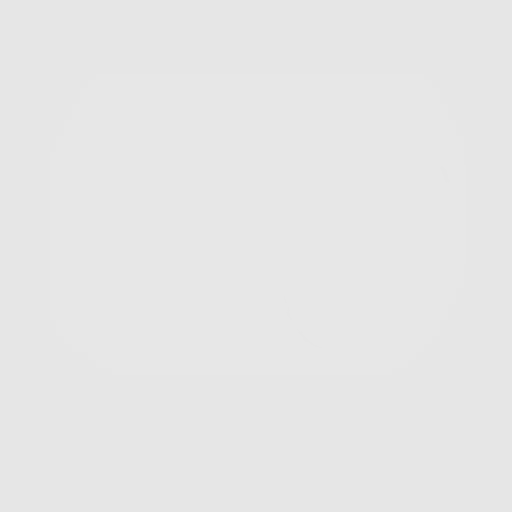
[frame 72/322  lung]
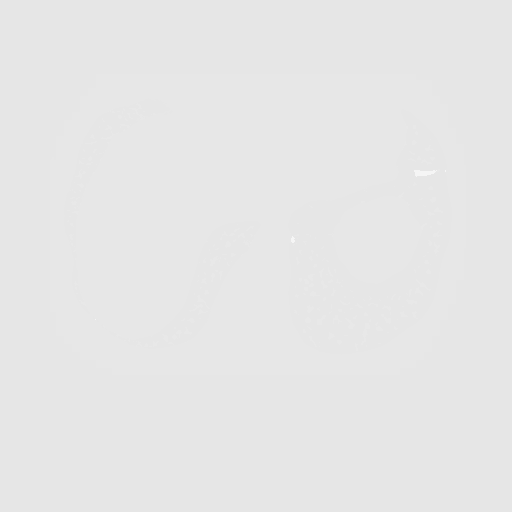
[frame 108/322  lung]
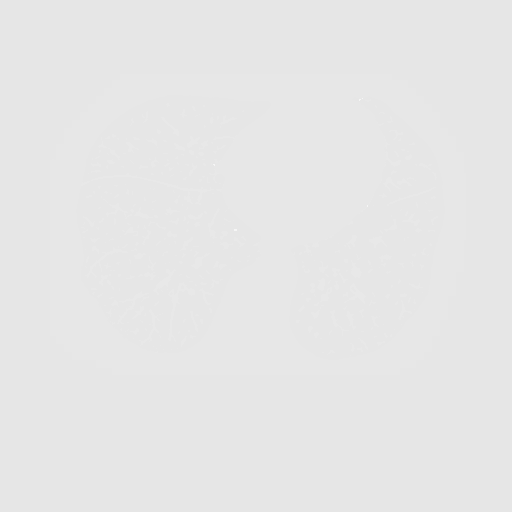
[frame 143/322  mediastinal]
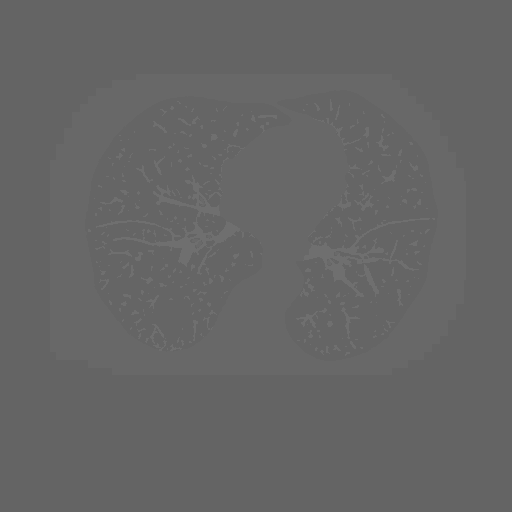
[frame 143/322  lung]
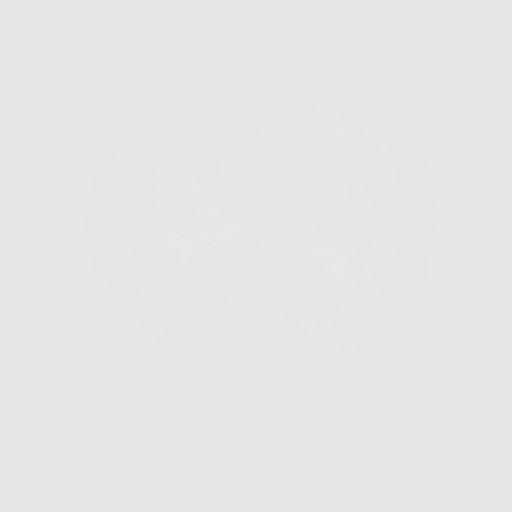
[frame 179/322  lung]
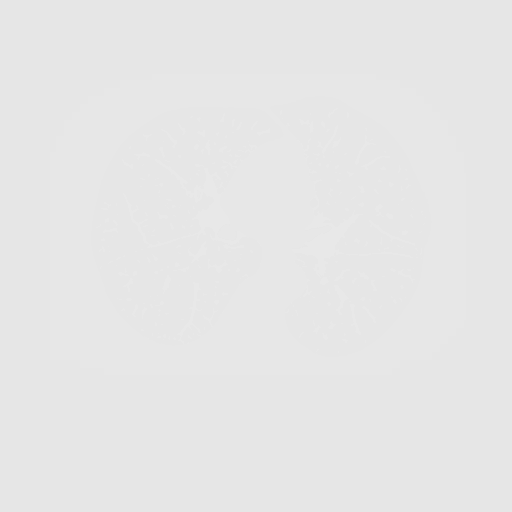
[frame 215/322  lung]
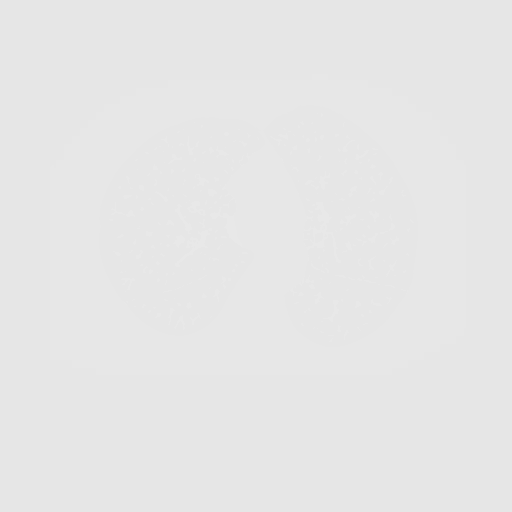
[frame 250/322  lung]
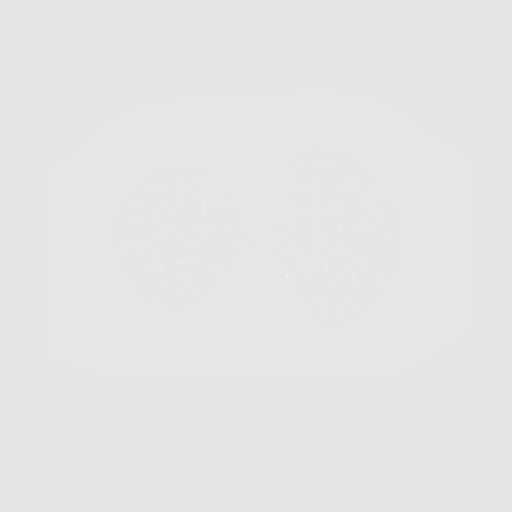
[frame 286/322  mediastinal]
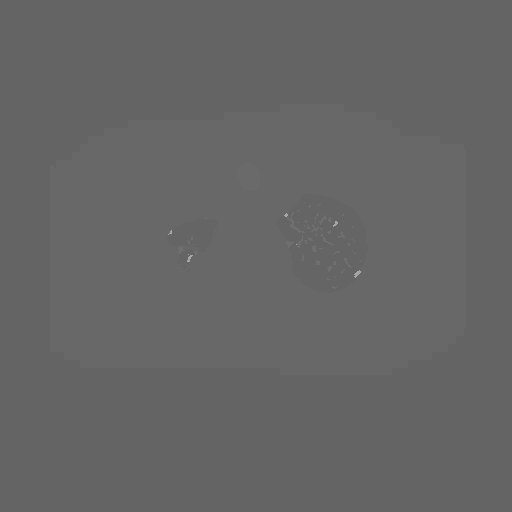
[frame 286/322  lung]
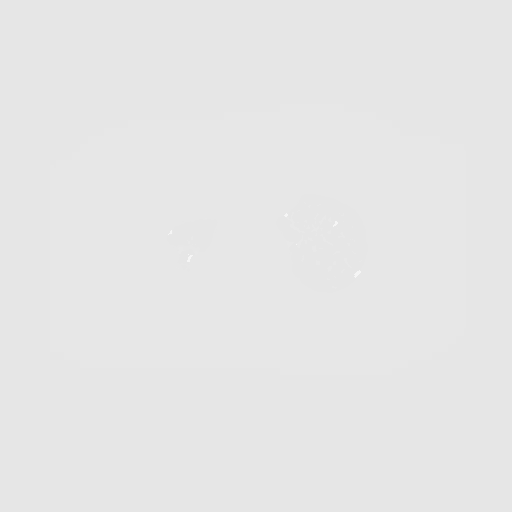
[frame 322/322  lung]
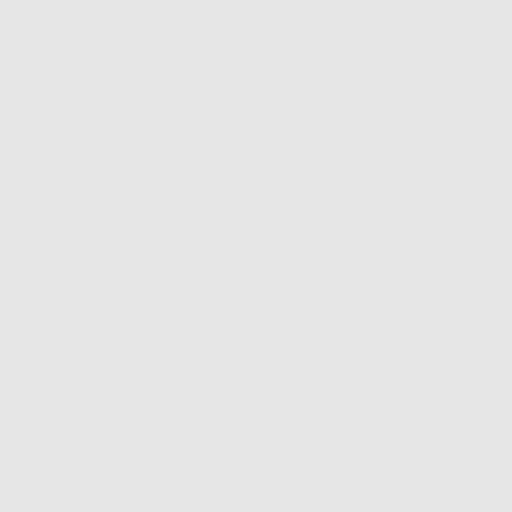

[10 of 10 positions shown; findings below may reference images not displayed]

FINDINGS: Cardiovascular: The heart is normal in size. No pericardial
effusion.

No evidence of thoracic aortic aneurysm. Atherosclerotic
calcifications of the aortic arch.

Three vessel coronary atherosclerosis.

Mediastinum/Nodes: Small mediastinal lymph nodes which do not meet
pathologic CT size criteria.

Visualized thyroid is unremarkable.

Lungs/Pleura: Biapical pleural-parenchymal scarring.

Mild to moderate centrilobular and paraseptal emphysematous changes,
upper lung predominant.

No focal consolidation.

6.3 mm flat irregular nodule along the right minor fissure,
unchanged. Additional bilateral upper lobe nodules measuring up to
3.6 mm, unchanged.

No pleural effusion or pneumothorax.

Upper Abdomen: Visualized upper abdomen is notable for prior
cholecystectomy and mild vascular calcifications.

Musculoskeletal: Visualized osseous structures are within normal
limits.
IMPRESSION: Lung-RADS 2, benign appearance or behavior. Continue annual
screening with low-dose chest CT without contrast in 12 months.

Aortic Atherosclerosis (JJD9N-[id number].S) and Emphysema (JJD9N-V4V.2).
